# Patient Record
Sex: Female | Born: 1980 | Hispanic: Yes | Marital: Married | Smoking: Never smoker
Health system: Southern US, Community
[De-identification: ages and names within clinical notes are randomized; demographics above are authoritative.]

## PROBLEM LIST (undated history)

## (undated) DIAGNOSIS — Z789 Other specified health status: Secondary | ICD-10-CM

## (undated) HISTORY — PX: NO PAST SURGERIES: SHX2092

---

## 2004-04-09 ENCOUNTER — Inpatient Hospital Stay: Payer: Self-pay

## 2006-08-21 ENCOUNTER — Ambulatory Visit: Payer: Self-pay | Admitting: Family Medicine

## 2006-12-27 ENCOUNTER — Inpatient Hospital Stay: Payer: Self-pay | Admitting: Obstetrics and Gynecology

## 2015-07-17 ENCOUNTER — Other Ambulatory Visit: Payer: Self-pay | Admitting: Family Medicine

## 2015-07-17 DIAGNOSIS — Z3482 Encounter for supervision of other normal pregnancy, second trimester: Secondary | ICD-10-CM

## 2015-07-25 ENCOUNTER — Ambulatory Visit
Admission: RE | Admit: 2015-07-25 | Discharge: 2015-07-25 | Disposition: A | Discharge status: Home / Self Care | Payer: Self-pay | Source: Ambulatory Visit | Attending: Family Medicine | Admitting: Family Medicine

## 2015-07-25 DIAGNOSIS — Z3482 Encounter for supervision of other normal pregnancy, second trimester: Secondary | ICD-10-CM | POA: Insufficient documentation

## 2015-07-25 DIAGNOSIS — Z3A18 18 weeks gestation of pregnancy: Secondary | ICD-10-CM | POA: Insufficient documentation

## 2015-12-18 LAB — OB RESULTS CONSOLE RUBELLA ANTIBODY, IGM: RUBELLA: IMMUNE

## 2015-12-18 LAB — OB RESULTS CONSOLE RPR: RPR: NONREACTIVE

## 2015-12-18 LAB — OB RESULTS CONSOLE ANTIBODY SCREEN: Antibody Screen: NEGATIVE

## 2015-12-18 LAB — OB RESULTS CONSOLE HEPATITIS B SURFACE ANTIGEN: Hepatitis B Surface Ag: NEGATIVE

## 2015-12-18 LAB — OB RESULTS CONSOLE ABO/RH: RH TYPE: POSITIVE

## 2015-12-18 LAB — OB RESULTS CONSOLE GC/CHLAMYDIA
CHLAMYDIA, DNA PROBE: NEGATIVE
GC PROBE AMP, GENITAL: NEGATIVE

## 2015-12-18 LAB — OB RESULTS CONSOLE GBS: GBS: POSITIVE

## 2015-12-18 LAB — OB RESULTS CONSOLE HIV ANTIBODY (ROUTINE TESTING): HIV: NONREACTIVE

## 2015-12-18 LAB — OB RESULTS CONSOLE VARICELLA ZOSTER ANTIBODY, IGG: VARICELLA IGG: NON-IMMUNE/NOT IMMUNE

## 2015-12-21 ENCOUNTER — Inpatient Hospital Stay
Admission: EM | Admit: 2015-12-21 | Discharge: 2015-12-23 | DRG: 775 | Disposition: A | Discharge status: Home / Self Care | Payer: Medicaid Other | Attending: Obstetrics and Gynecology | Admitting: Obstetrics and Gynecology

## 2015-12-21 ENCOUNTER — Inpatient Hospital Stay: Payer: Medicaid Other | Admitting: Anesthesiology

## 2015-12-21 DIAGNOSIS — D62 Acute posthemorrhagic anemia: Secondary | ICD-10-CM | POA: Diagnosis present

## 2015-12-21 DIAGNOSIS — O99824 Streptococcus B carrier state complicating childbirth: Principal | ICD-10-CM | POA: Diagnosis present

## 2015-12-21 DIAGNOSIS — O9902 Anemia complicating childbirth: Secondary | ICD-10-CM | POA: Diagnosis present

## 2015-12-21 DIAGNOSIS — Z3A40 40 weeks gestation of pregnancy: Secondary | ICD-10-CM

## 2015-12-21 HISTORY — DX: Other specified health status: Z78.9

## 2015-12-21 LAB — CBC
HCT: 35.2 % (ref 35.0–47.0)
HEMOGLOBIN: 12.4 g/dL (ref 12.0–16.0)
MCH: 32 pg (ref 26.0–34.0)
MCHC: 35.3 g/dL (ref 32.0–36.0)
MCV: 90.7 fL (ref 80.0–100.0)
Platelets: 186 10*3/uL (ref 150–440)
RBC: 3.88 MIL/uL (ref 3.80–5.20)
RDW: 14.3 % (ref 11.5–14.5)
WBC: 8.2 10*3/uL (ref 3.6–11.0)

## 2015-12-21 LAB — TYPE AND SCREEN
ABO/RH(D): B POS
Antibody Screen: NEGATIVE

## 2015-12-21 MED ORDER — OXYTOCIN 10 UNIT/ML IJ SOLN
INTRAMUSCULAR | Status: AC
  Filled 2015-12-21: qty 2

## 2015-12-21 MED ORDER — SODIUM CHLORIDE 0.9% FLUSH: 3.0000 mL | INTRAVENOUS | Status: DC | PRN

## 2015-12-21 MED ORDER — ONDANSETRON HCL 4 MG/2ML IJ SOLN: 4.0000 mg | Freq: Four times a day (QID) | INTRAMUSCULAR | Status: DC | PRN

## 2015-12-21 MED ORDER — NALBUPHINE HCL 10 MG/ML IJ SOLN: 5.0000 mg | INTRAMUSCULAR | Status: DC | PRN

## 2015-12-21 MED ORDER — MISOPROSTOL 200 MCG PO TABS
ORAL_TABLET | ORAL | Status: AC
  Filled 2015-12-21: qty 4

## 2015-12-21 MED ORDER — OXYCODONE-ACETAMINOPHEN 5-325 MG PO TABS: 1.0000 | ORAL_TABLET | ORAL | Status: DC | PRN

## 2015-12-21 MED ORDER — SODIUM CHLORIDE FLUSH 0.9 % IV SOLN
INTRAVENOUS | Status: AC
  Filled 2015-12-21: qty 10

## 2015-12-21 MED ORDER — AMPICILLIN SODIUM 2 G IJ SOLR
2.0000 g | Freq: Once | INTRAMUSCULAR | Status: DC
Start: 2015-12-21 — End: 2015-12-21

## 2015-12-21 MED ORDER — DEXTROSE 5 % IV SOLN: 1.0000 ug/kg/h | INTRAVENOUS | Status: DC | PRN

## 2015-12-21 MED ORDER — LACTATED RINGERS IV SOLN
INTRAVENOUS | Status: DC
Start: 2015-12-21 — End: 2015-12-22
  Administered 2015-12-21 – 2015-12-22 (×3): via INTRAVENOUS

## 2015-12-21 MED ORDER — PENICILLIN G POTASSIUM 5000000 UNITS IJ SOLR
2.5000 10*6.[IU] | INTRAMUSCULAR | Status: DC
  Administered 2015-12-21 (×2): 2.5 10*6.[IU] via INTRAVENOUS
  Filled 2015-12-21 (×10): qty 2.5

## 2015-12-21 MED ORDER — LACTATED RINGERS IV SOLN: 500.0000 mL | INTRAVENOUS | Status: DC | PRN

## 2015-12-21 MED ORDER — LIDOCAINE HCL (PF) 1 % IJ SOLN
INTRAMUSCULAR | Status: AC
  Administered 2015-12-21: 30 mL via SUBCUTANEOUS
  Filled 2015-12-21: qty 30

## 2015-12-21 MED ORDER — BUPIVACAINE HCL (PF) 0.25 % IJ SOLN
INTRAMUSCULAR | Status: DC | PRN
  Administered 2015-12-21: 5 mL via EPIDURAL

## 2015-12-21 MED ORDER — DEXTROSE 5 % IV SOLN
5.0000 10*6.[IU] | Freq: Once | INTRAVENOUS | Status: AC
  Administered 2015-12-21: 5 10*6.[IU] via INTRAVENOUS
  Filled 2015-12-21: qty 5

## 2015-12-21 MED ORDER — BUTORPHANOL TARTRATE 1 MG/ML IJ SOLN: 1.0000 mg | INTRAMUSCULAR | Status: DC | PRN

## 2015-12-21 MED ORDER — ACETAMINOPHEN 325 MG PO TABS
650.0000 mg | ORAL_TABLET | ORAL | Status: DC | PRN
  Administered 2015-12-21: 650 mg via ORAL
  Filled 2015-12-21: qty 2

## 2015-12-21 MED ORDER — OXYTOCIN BOLUS FROM INFUSION
500.0000 mL | Freq: Once | INTRAVENOUS | Status: AC
  Administered 2015-12-21: 500 mL via INTRAVENOUS

## 2015-12-21 MED ORDER — NALBUPHINE HCL 10 MG/ML IJ SOLN: 5.0000 mg | Freq: Once | INTRAMUSCULAR | Status: DC | PRN

## 2015-12-21 MED ORDER — BUTORPHANOL TARTRATE 1 MG/ML IJ SOLN
1.0000 mg | INTRAMUSCULAR | Status: DC | PRN
  Administered 2015-12-21: 1 mg via INTRAVENOUS
  Filled 2015-12-21: qty 1

## 2015-12-21 MED ORDER — LIDOCAINE HCL (PF) 1 % IJ SOLN
30.0000 mL | INTRAMUSCULAR | Status: DC | PRN
  Administered 2015-12-21: 30 mL via SUBCUTANEOUS
  Filled 2015-12-21: qty 30

## 2015-12-21 MED ORDER — FENTANYL 2.5 MCG/ML W/ROPIVACAINE 0.2% IN NS 100 ML EPIDURAL INFUSION (ARMC-ANES)
EPIDURAL | Status: DC | PRN
  Administered 2015-12-21: 10 mL/h via EPIDURAL

## 2015-12-21 MED ORDER — OXYCODONE-ACETAMINOPHEN 5-325 MG PO TABS: 2.0000 | ORAL_TABLET | ORAL | Status: DC | PRN

## 2015-12-21 MED ORDER — FENTANYL 2.5 MCG/ML W/ROPIVACAINE 0.2% IN NS 100 ML EPIDURAL INFUSION (ARMC-ANES)
10.0000 mL/h | EPIDURAL | Status: DC
Start: 2015-12-22 — End: 2015-12-22

## 2015-12-21 MED ORDER — LIDOCAINE-EPINEPHRINE (PF) 1.5 %-1:200000 IJ SOLN
INTRAMUSCULAR | Status: DC | PRN
  Administered 2015-12-21: 3 mL via EPIDURAL

## 2015-12-21 MED ORDER — MISOPROSTOL 25 MCG QUARTER TABLET
25.0000 ug | ORAL_TABLET | ORAL | Status: DC | PRN
  Filled 2015-12-21: qty 1

## 2015-12-21 MED ORDER — LIDOCAINE HCL (PF) 1 % IJ SOLN
INTRAMUSCULAR | Status: DC | PRN
  Administered 2015-12-21: 1 mL via INTRADERMAL

## 2015-12-21 MED ORDER — AMMONIA AROMATIC IN INHA
RESPIRATORY_TRACT | Status: AC
  Filled 2015-12-21: qty 10

## 2015-12-21 MED ORDER — DIPHENHYDRAMINE HCL 25 MG PO CAPS: 25.0000 mg | ORAL_CAPSULE | ORAL | Status: DC | PRN

## 2015-12-21 MED ORDER — NALOXONE HCL 0.4 MG/ML IJ SOLN: 0.4000 mg | INTRAMUSCULAR | Status: DC | PRN

## 2015-12-21 MED ORDER — FENTANYL 2.5 MCG/ML W/ROPIVACAINE 0.2% IN NS 100 ML EPIDURAL INFUSION (ARMC-ANES)
EPIDURAL | Status: AC
  Filled 2015-12-21: qty 100

## 2015-12-21 MED ORDER — DIPHENHYDRAMINE HCL 50 MG/ML IJ SOLN: 12.5000 mg | INTRAMUSCULAR | Status: DC | PRN

## 2015-12-21 MED ORDER — OXYTOCIN 40 UNITS IN LACTATED RINGERS INFUSION - SIMPLE MED
1.0000 m[IU]/min | INTRAVENOUS | Status: DC
  Administered 2015-12-21: 1 m[IU]/min via INTRAVENOUS
  Filled 2015-12-21: qty 1000

## 2015-12-21 MED ORDER — OXYTOCIN 40 UNITS IN LACTATED RINGERS INFUSION - SIMPLE MED
2.5000 [IU]/h | INTRAVENOUS | Status: DC
Start: 2015-12-21 — End: 2015-12-22

## 2015-12-21 MED ORDER — SOD CITRATE-CITRIC ACID 500-334 MG/5ML PO SOLN: 30.0000 mL | ORAL | Status: DC | PRN

## 2015-12-21 MED ORDER — TERBUTALINE SULFATE 1 MG/ML IJ SOLN: 0.2500 mg | Freq: Once | INTRAMUSCULAR | Status: DC | PRN

## 2015-12-21 NOTE — Lactation Note (Signed)
Consultation Note  Patient Name: Abbagail Hilinski [id number] Date: 12/21/2015 Reason for consult: Initial assessment   Maternal Data  Mother initially stated that she wants to feed both breast and bottle. When I clarified she stated that in the hospital seh would breast feed only. She was talking about both over the baby's breast feeding time.Lorenz Coaster Jaykob Minichiello 12/21/2015, 12:16 PM

## 2015-12-21 NOTE — Progress Notes (Signed)
S: Resting comfortably with epidural. + CTX, + LOF, no  VB O: Vitals:   12/21/15 1724 12/21/15 1819 12/21/15 1821 12/21/15 1828  BP: 107/73 122/66 121/65 116/71  Pulse: 79 73 78 87  Resp: 18     Temp: 98.2 F (36.8 C)     TempSrc: Oral     SpO2:   98% 97%  Weight:      Height:         Gen: NAD, AAOx3      Abd: FNTTP      Ext: Non-tender, Nonedmeatous    FHT:+ mod var + accelerations no decelerations, Cat 1 TOCO: Q2  Min, MVU 290-310 SVE:5/80%/vtx-1   A/P:  35 y.o. yo G3P2002 at [redacted]w[redacted]d for IOL  Labor:Progressing  FWB: Reassuring Cat 1 tracing. EFW 7#14oz  GBS: pos  Antibiotics per orders   Catheryn Bacon 6:39 PM

## 2015-12-21 NOTE — Anesthesia Preprocedure Evaluation (Signed)
Anesthesia Evaluation  Patient identified by MRN, date of birth, ID band Patient awake    Reviewed: Allergy & Precautions, H&P , NPO status , Patient's Chart, lab work & pertinent test results  Airway Mallampati: III  TM Distance: >3 FB Neck ROM: full    Dental  (+) Poor Dentition   Pulmonary neg pulmonary ROS,    Pulmonary exam normal breath sounds clear to auscultation       Cardiovascular Exercise Tolerance: Good (-) hypertensionnegative cardio ROS Normal cardiovascular exam Rhythm:regular Rate:Normal     Neuro/Psych  Headaches, Seizures: has a headache pre epidural placement.     GI/Hepatic negative GI ROS,   Endo/Other    Renal/GU   negative genitourinary   Musculoskeletal   Abdominal   Peds  Hematology negative hematology ROS (+)   Anesthesia Other Findings Past Medical History: No date: Medical history non-contributory  Past Surgical History: No date: NO PAST SURGERIES  BMI    Body Mass Index:  39.87 kg/m      Reproductive/Obstetrics (+) Pregnancy                             Anesthesia Physical Anesthesia Plan  ASA: II  Anesthesia Plan: Epidural   Post-op Pain Management:    Induction:   Airway Management Planned:   Additional Equipment:   Intra-op Plan:   Post-operative Plan:   Informed Consent: I have reviewed the patients History and Physical, chart, labs and discussed the procedure including the risks, benefits and alternatives for the proposed anesthesia with the patient or authorized representative who has indicated his/her understanding and acceptance.     Plan Discussed with: Anesthesiologist  Anesthesia Plan Comments:         Anesthesia Quick Evaluation

## 2015-12-21 NOTE — Progress Notes (Signed)
S: Resting comfortably without epidural. + CTX, no LOF, VB O: Vitals:   12/21/15 1136 12/21/15 1401 12/21/15 1403 12/21/15 1604  BP: 104/70 (!) 94/52 (!) 95/49 106/65  Pulse: 91 76 84 77  Resp: 18   18  Temp:    98 F (36.7 C)  TempSrc:    Oral  Weight:      Height:         Gen: NAD, AAOx3      Abd: FNTTP      Ext: Non-tender, Nonedmeatous    FHT: +mod var + accelerations no decelerations TOCO: Q 4-5 min SVE:3/80/vtx-2 Pitocin at 3 mun/min AROM for lt mec mod amt.  At 1616 A/P:  35 y.o. yo G3P2002 at [redacted]w[redacted]d for IOL  Labor: progressing  FWB: Reassuring Cat 1 tracing. EFW 7#14oz  GBS: pos  Antibiotics   Catheryn Bacon 4:19 PM

## 2015-12-21 NOTE — H&P (Signed)
HISTORY AND PHYSICAL  HISTORY OF PRESENT ILLNESS: Sarah Patton is a 35 y.o. G3P1002 at [redacted]w[redacted]d by LMP of unkown dates & [place]  At  18 5/7  week ultrasound with EDD of 12/21/15 with a pregnancy complicated by  presenting for induction of labor.   She has not  been having contractions Q 6 mins and denies leakage of fluid, vaginal bleeding, or decreased fetal movement.     REVIEW OF SYSTEMS: A complete review of systems was performed and was specifically negative for headache, changes in vision, RUQ pain, shortness of breath, chest pain, lower extremity edema and dysuria.   HISTORY:  Past Medical History:  Diagnosis Date  . Medical history non-contributory     Past Surgical History:  Procedure Laterality Date  . NO PAST SURGERIES      No current facility-administered medications on file prior to encounter.    No current outpatient prescriptions on file prior to encounter.     No Known Allergies  OB History  Gravida Para Term Preterm AB Living  3 1 1     2   SAB TAB Ectopic Multiple Live Births          2    # Outcome Date GA Lbr Len/2nd Weight Sex Delivery Anes PTL Lv  3 Current           2 Term 02/27/07 [redacted]w[redacted]d   F Vag-Spont   LIV  1 Gravida 04/09/04 [redacted]w[redacted]d   M Vag-Spont   LIV      Gynecologic History: History of Abnormal Pap Smear: neg History of STI: neg  Social History  Substance Use Topics  . Smoking status: Never Smoker  . Smokeless tobacco: Never Used  . Alcohol use No    PHYSICAL EXAM: Temp:  [98.2 F (36.8 C)] 98.2 F (36.8 C) (07/27 0805) Pulse Rate:  [102] 102 (07/27 0805) Resp:  [18] 18 (07/27 0805) BP: (106)/(65) 106/65 (07/27 0805) Weight:  [218 lb (98.9 kg)] 218 lb (98.9 kg) (07/27 0805)  GENERAL: NAD AAOx3 CHEST:CTAB no increased work of breathing CV:RRR no appreciable murmurs, rubs, gallops ABDOMEN: gravid, nontender, EFW 7#14oz by [place] EXTREMITIES:  Warm and well-perfused, nontender, nonedematous,+ DTRs 0clonus CERVIX: 2/ 50/  vtx-3  FHTs: 140  baseline with mod variability + accelerations and no  decelerations  Toco:UC's q 6 mins  DIAGNOSTIC STUDIES: No results for input(s): WBC, HGB, HCT, PLT, NA, K, CL, CO2, BUN, CREATININE, LABGLOM, GLUCOSE, CALCIUM, BILIDIR, ALKPHOS, AST, ALT, PROT, MG in the last 168 hours.  Invalid input(s): LABALB, UA  PRENATAL STUDIES:  Prenatal Labs:  MBT: B pos; Rubella immune, Varicella non- immune, HIV neg, RPR neg, Hep B neg, GC/CT neg, GBS pos, glucola 88  Last [place]  AF wnl, normal anatomy  ASSESSMENT AND PLAN:  1. Fetal Well being  - Fetal Tracing:q 6 mins - Ultrasound:  reviewed, as above - Group B Streptococcus: pos - Presentation: vtx  confirmed byCJ  2. Routine OB: - Prenatal labs reviewed, as above - Rh B pos  3. Induction of Labor:  -  Contractions external toco in place -  Pelvis proven to 8 # -  Plan for induction with Pitocin  4. Post Partum Planning: - Infant feeding: Breast - Contraception: to be determined

## 2015-12-21 NOTE — Anesthesia Procedure Notes (Signed)
Epidural Patient location during procedure: OB Start time: 12/21/2015 6:09 PM End time: 12/21/2015 6:11 PM  Staffing Anesthesiologist: Katy Fitch K Performed: anesthesiologist   Preanesthetic Checklist Completed: patient identified, site marked, surgical consent, pre-op evaluation, timeout performed, IV checked, risks and benefits discussed and monitors and equipment checked  Epidural Patient position: sitting Prep: Betadine Patient monitoring: heart rate, continuous pulse ox and blood pressure Approach: midline Location: L4-L5 Injection technique: LOR saline  Needle:  Needle type: Tuohy  Needle gauge: 17 G Needle length: 9 cm and 9 Needle insertion depth: 5 cm Catheter type: closed end flexible Catheter size: 19 Gauge Catheter at skin depth: 10 cm Test dose: negative and 1.5% lidocaine with Epi 1:200 K  Assessment Sensory level: T10 Events: blood not aspirated, injection not painful, no injection resistance, negative IV test and no paresthesia  Additional Notes Pt. Evaluated and documentation done after procedure finished. Patient identified. Risks/Benefits/Options discussed with patient including but not limited to bleeding, infection, nerve damage, paralysis, failed block, incomplete pain control, headache, blood pressure changes, nausea, vomiting, reactions to medication both or allergic, itching and postpartum back pain. Confirmed with bedside nurse the patient's most recent platelet count. Confirmed with patient that they are not currently taking any anticoagulation, have any bleeding history or any family history of bleeding disorders. Patient expressed understanding and wished to proceed. All questions were answered. Sterile technique was used throughout the entire procedure. Please see nursing notes for vital signs. Test dose was given through epidural catheter and negative prior to continuing to dose epidural or start infusion. Warning signs of high block given to  the patient including shortness of breath, tingling/numbness in hands, complete motor block, or any concerning symptoms with instructions to call for help. Patient was given instructions on fall risk and not to get out of bed. All questions and concerns addressed with instructions to call with any issues or inadequate analgesia.   Patient tolerated the insertion well without immediate complications.Reason for block:procedure for pain

## 2015-12-21 NOTE — Progress Notes (Signed)
S: Resting comfortably without epidural. + CTX, no LOF, VB O: Vitals:   12/21/15 0805 12/21/15 0954 12/21/15 1136  BP: 106/65  104/70  Pulse: (!) 102  91  Resp: 18 18 18   Temp: 98.2 F (36.8 C)    TempSrc: Oral    Weight: 218 lb (98.9 kg)    Height: 5\' 2"  (1.575 m)       Gen: NAD, AAOx3      Abd: FNTTP      Ext: Non-tender, Nonedmeatous    FHT: +mod var + accelerations no decelerations TOCO: Q 6 min SVE: not re-evaluated   A/P:  35 y.o. yo G3P2002 at [redacted]w[redacted]d for  IOL   Labor: progressing on Pitocin per protocol  FWB: Reassuring Cat 1 tracing. +accels, No decels  GBS: pos    Catheryn Bacon 2:06 PM

## 2015-12-21 NOTE — Progress Notes (Signed)
S: Resting comfortably with epidural. + CTX, Lt mec LOF, no VB O: Vitals:   12/21/15 1828 12/21/15 1836 12/21/15 1850 12/21/15 1906  BP: 116/71 107/65 120/64 115/66  Pulse: 87 81 76 85  Resp:   16   Temp:   97.9 F (36.6 C)   TempSrc:   Oral   SpO2: 97% 97%    Weight:      Height:         Gen: NAD, AAOx3      Abd: FNTTP      Ext: Non-tender, Nonedmeatous    FHT: +mod var + accelerations no decelerations TOCO: Q 2-2.5  Min, MVU's >290 SVE: 6/100/vtx0   A/P:  35 y.o. yo G3P2002 at [redacted]w[redacted]d for  IOL  Labor: progressing  FWB: Reassuring Cat 1 tracing.   GBS:pos  Continue to monitor UC/FHT's  Antiboitics per orders   Catheryn Bacon 8:25 PM

## 2015-12-21 NOTE — Progress Notes (Signed)
VAGINAL DELIVERY NOTE:  Date of Delivery: 12/21/2015 Primary OB:  [facility] Gestational Age/EDD: [redacted]w[redacted]d 12/21/2015, by Ultrasound Antepartum complications: None Attending Physician: Beasely/CJones, CNM Delivery Type: NSVD of viable female  Anesthesia:Local 1% for repair Laceration: 2nd degree lac, Lt labial lac Episiotomy: none Placenta: SDOP intact, lt mec stained Intrapartum complications: None Estimated Blood Loss:400 mls GBS:pos Procedure Details: CTSP due to being C/C/+1 station. Upon exam legs in lithotomy pos, vtx visible at a +3 station. Pt pushed and within 3 pushes, Vtx del, no CAN, then, ant and post shoulder & body del at 2104. To mom's abd. CCx2 and cut by dad after delayed cord clamping. Cord blood obtained and then discarded. SDOP intact at 2109 with FF and lochia mod, no clots. Pitocin IV drip given per protocol. VSS. Hemostasis achieved but, some massage of the fundus was necessary even though it stayed firm. 1% xylocaine inserted for repair. 3-0 CH on CT for repair. Skin to skin bonding with mom.   Baby was 8 #, 1 oz, baby named "Sarah Patton". Plans to breastfeed.

## 2015-12-22 LAB — CBC
HCT: 29.5 % — ABNORMAL LOW (ref 35.0–47.0)
Hemoglobin: 10.3 g/dL — ABNORMAL LOW (ref 12.0–16.0)
MCH: 31.6 pg (ref 26.0–34.0)
MCHC: 34.9 g/dL (ref 32.0–36.0)
MCV: 90.5 fL (ref 80.0–100.0)
Platelets: 171 10*3/uL (ref 150–440)
RBC: 3.26 MIL/uL — ABNORMAL LOW (ref 3.80–5.20)
RDW: 14.2 % (ref 11.5–14.5)
WBC: 10.6 10*3/uL (ref 3.6–11.0)

## 2015-12-22 LAB — RPR: RPR Ser Ql: NONREACTIVE

## 2015-12-22 MED ORDER — BISACODYL 10 MG RE SUPP: 10.0000 mg | Freq: Every day | RECTAL | Status: DC | PRN

## 2015-12-22 MED ORDER — ZOLPIDEM TARTRATE 5 MG PO TABS: 5.0000 mg | ORAL_TABLET | Freq: Every evening | ORAL | Status: DC | PRN

## 2015-12-22 MED ORDER — SODIUM CHLORIDE 0.9 % IV SOLN: 250.0000 mL | INTRAVENOUS | Status: DC | PRN

## 2015-12-22 MED ORDER — BENZOCAINE-MENTHOL 20-0.5 % EX AERO: 1.0000 "application " | INHALATION_SPRAY | CUTANEOUS | Status: DC | PRN

## 2015-12-22 MED ORDER — MEASLES, MUMPS & RUBELLA VAC ~~LOC~~ INJ
0.5000 mL | INJECTION | Freq: Once | SUBCUTANEOUS | Status: DC
  Filled 2015-12-22 (×2): qty 0.5

## 2015-12-22 MED ORDER — OXYCODONE HCL 5 MG PO TABS
10.0000 mg | ORAL_TABLET | ORAL | Status: DC | PRN
Start: 2015-12-22 — End: 2015-12-23

## 2015-12-22 MED ORDER — ONDANSETRON HCL 4 MG/2ML IJ SOLN: 4.0000 mg | INTRAMUSCULAR | Status: DC | PRN

## 2015-12-22 MED ORDER — DIBUCAINE 1 % RE OINT: 1.0000 "application " | TOPICAL_OINTMENT | RECTAL | Status: DC | PRN

## 2015-12-22 MED ORDER — ACETAMINOPHEN 325 MG PO TABS: 650.0000 mg | ORAL_TABLET | ORAL | Status: DC | PRN

## 2015-12-22 MED ORDER — SODIUM CHLORIDE 0.9% FLUSH: 3.0000 mL | Freq: Two times a day (BID) | INTRAVENOUS | Status: DC

## 2015-12-22 MED ORDER — IBUPROFEN 600 MG PO TABS
600.0000 mg | ORAL_TABLET | Freq: Four times a day (QID) | ORAL | Status: DC
  Administered 2015-12-22 – 2015-12-23 (×6): 600 mg via ORAL
  Filled 2015-12-22 (×6): qty 1

## 2015-12-22 MED ORDER — PRENATAL MULTIVITAMIN CH
1.0000 | ORAL_TABLET | Freq: Every day | ORAL | Status: DC
  Administered 2015-12-22 – 2015-12-23 (×2): 1 via ORAL
  Filled 2015-12-22 (×2): qty 1

## 2015-12-22 MED ORDER — TETANUS-DIPHTH-ACELL PERTUSSIS 5-2.5-18.5 LF-MCG/0.5 IM SUSP: 0.5000 mL | Freq: Once | INTRAMUSCULAR | Status: DC

## 2015-12-22 MED ORDER — COCONUT OIL OIL
1.0000 "application " | TOPICAL_OIL | Status: DC | PRN
  Filled 2015-12-22: qty 120

## 2015-12-22 MED ORDER — SENNOSIDES-DOCUSATE SODIUM 8.6-50 MG PO TABS
2.0000 | ORAL_TABLET | ORAL | Status: DC
  Administered 2015-12-22 – 2015-12-23 (×2): 2 via ORAL
  Filled 2015-12-22 (×2): qty 2

## 2015-12-22 MED ORDER — WITCH HAZEL-GLYCERIN EX PADS: 1.0000 "application " | MEDICATED_PAD | CUTANEOUS | Status: DC | PRN

## 2015-12-22 MED ORDER — SIMETHICONE 80 MG PO CHEW: 80.0000 mg | CHEWABLE_TABLET | ORAL | Status: DC | PRN

## 2015-12-22 MED ORDER — ONDANSETRON HCL 4 MG PO TABS: 4.0000 mg | ORAL_TABLET | ORAL | Status: DC | PRN

## 2015-12-22 MED ORDER — OXYCODONE HCL 5 MG PO TABS: 5.0000 mg | ORAL_TABLET | ORAL | Status: DC | PRN

## 2015-12-22 MED ORDER — DIPHENHYDRAMINE HCL 25 MG PO CAPS: 25.0000 mg | ORAL_CAPSULE | Freq: Four times a day (QID) | ORAL | Status: DC | PRN

## 2015-12-22 MED ORDER — SODIUM CHLORIDE 0.9% FLUSH: 3.0000 mL | INTRAVENOUS | Status: DC | PRN

## 2015-12-22 MED ORDER — FLEET ENEMA 7-19 GM/118ML RE ENEM: 1.0000 | ENEMA | Freq: Every day | RECTAL | Status: DC | PRN

## 2015-12-22 NOTE — Anesthesia Postprocedure Evaluation (Signed)
Anesthesia Post Note  Patient: Sarah Patton  Procedure(s) Performed: * No procedures listed *  Patient location during evaluation: Other Anesthesia Type: Epidural Level of consciousness: awake and alert Pain management: pain level controlled Vital Signs Assessment: post-procedure vital signs reviewed and stable Respiratory status: spontaneous breathing Cardiovascular status: blood pressure returned to baseline and stable Postop Assessment: no headache, no backache and no signs of nausea or vomiting Anesthetic complications: no    Last Vitals:  Vitals:   12/22/15 0013 12/22/15 0342  BP: 101/60 (!) 98/52  Pulse: 81 84  Resp: 20 20  Temp: 36.7 C 36.7 C    Last Pain:  Vitals:   12/22/15 0342  TempSrc: Oral  PainSc:                  Alison Stalling

## 2015-12-22 NOTE — Progress Notes (Signed)
Post Partum Day 1 Subjective: no complaints, up ad lib, voiding and tolerating PO  Objective: Blood pressure (!) 90/54, pulse 78, temperature 98 F (36.7 C), temperature source Oral, resp. rate 20, height 5\' 2"  (1.575 m), weight 218 lb (98.9 kg), SpO2 97 %, unknown if currently breastfeeding.  Physical Exam:  General: alert, cooperative and no distress Lochia: appropriate Uterine Fundus: firm Incision: healing well DVT Evaluation: No evidence of DVT seen on physical exam.   Recent Labs  12/21/15 1009 12/22/15 0512  HGB 12.4 10.3*  HCT 35.2 29.5*    Assessment/Plan: Plan for discharge tomorrow and Breastfeeding   LOS: 1 day   Sarah Patton 12/22/2015, 6:09 PM

## 2015-12-23 MED ORDER — IBUPROFEN 600 MG PO TABS: 600.0000 mg | ORAL_TABLET | Freq: Four times a day (QID) | ORAL | 0 refills | Status: DC

## 2015-12-23 MED ORDER — VARICELLA VIRUS VACCINE LIVE 1350 PFU/0.5ML IJ SUSR
0.5000 mL | Freq: Once | INTRAMUSCULAR | Status: AC
  Administered 2015-12-23: 0.5 mL via SUBCUTANEOUS
  Filled 2015-12-23 (×2): qty 0.5

## 2015-12-23 MED ORDER — DOCUSATE SODIUM 100 MG PO CAPS: 100.0000 mg | ORAL_CAPSULE | Freq: Two times a day (BID) | ORAL | 2 refills | Status: DC | PRN

## 2015-12-23 MED ORDER — FERROUS SULFATE 325 (65 FE) MG PO TABS: 325.0000 mg | ORAL_TABLET | Freq: Two times a day (BID) | ORAL | 1 refills | Status: DC

## 2015-12-23 NOTE — Progress Notes (Signed)
Discharge instructions provided. Pt and sig other verbalize understanding of all instructions and follow-up care via interpreter.  Prescriptions given.  Pt discharged to home with infant at 1540 on 12/23/15 via wheelchair by CNA. Reed Breech, RN 12/23/2015 5:09 PM

## 2015-12-23 NOTE — Discharge Instructions (Signed)
Call your doctor for increased pain or vaginal bleeding, temperature above 100.4, depression, or concerns.  No strenuous activity or heavy lifting for 6 weeks.  No intercourse, tampons, douching, or enemas for 6 weeks.  No tub baths-showers only.  No driving for 2 weeks or while taking pain medications.  Continue prenatal vitamin and iron.  Increase calories and fluids while breastfeeding.

## 2015-12-23 NOTE — Discharge Summary (Signed)
Obstetric Discharge Summary Reason for Admission: induction of labor Prenatal Procedures: ultrasound Intrapartum Procedures: spontaneous vaginal delivery Postpartum Procedures: varicella Complications-Operative and Postpartum: 2 degree perineal laceration Hemoglobin  Date Value Ref Range Status  12/22/2015 10.3 (L) 12.0 - 16.0 g/dL Final   HCT  Date Value Ref Range Status  12/22/2015 29.5 (L) 35.0 - 47.0 % Final    Physical Exam:  General: alert, cooperative and appears stated age 35: appropriate Uterine Fundus: firm Incision: healing well DVT Evaluation: No evidence of DVT seen on physical exam.  Discharge Diagnoses: Term Pregnancy-delivered; varicella non-immune; acute blood loss anemia  Discharge Information: Date: 12/23/2015 Activity: pelvic rest Diet: routine Medications: Ibuprofen, Colace and Iron Condition: stable Instructions: refer to practice specific booklet Discharge to: home   Newborn Data: Live born female  Birth Weight: 8 lb 1.5 oz (3670 g) APGAR: 8, 9  Breastfeeding Nexplanon desired  Home with mother.  Sarah Patton 12/23/2015, 10:07 AM

## 2015-12-23 NOTE — Progress Notes (Signed)
Prenatal records indicate that pt received TDaP vaccine on 10/18/15. Reed Breech, RN 12/23/2015 1:25 PM

## 2017-05-31 ENCOUNTER — Emergency Department: Payer: No Typology Code available for payment source

## 2017-05-31 ENCOUNTER — Other Ambulatory Visit: Payer: Self-pay

## 2017-05-31 ENCOUNTER — Emergency Department
Admission: EM | Admit: 2017-05-31 | Discharge: 2017-05-31 | Disposition: A | Discharge status: Home / Self Care | Payer: No Typology Code available for payment source | Attending: Emergency Medicine | Admitting: Emergency Medicine

## 2017-05-31 DIAGNOSIS — S20219A Contusion of unspecified front wall of thorax, initial encounter: Secondary | ICD-10-CM | POA: Insufficient documentation

## 2017-05-31 DIAGNOSIS — M545 Low back pain, unspecified: Secondary | ICD-10-CM

## 2017-05-31 DIAGNOSIS — Y999 Unspecified external cause status: Secondary | ICD-10-CM | POA: Insufficient documentation

## 2017-05-31 DIAGNOSIS — Y939 Activity, unspecified: Secondary | ICD-10-CM | POA: Insufficient documentation

## 2017-05-31 DIAGNOSIS — Z79899 Other long term (current) drug therapy: Secondary | ICD-10-CM | POA: Diagnosis not present

## 2017-05-31 DIAGNOSIS — Y92410 Unspecified street and highway as the place of occurrence of the external cause: Secondary | ICD-10-CM | POA: Insufficient documentation

## 2017-05-31 LAB — POCT PREGNANCY, URINE: PREG TEST UR: NEGATIVE

## 2017-05-31 MED ORDER — NAPROXEN 500 MG PO TABS: 500.0000 mg | ORAL_TABLET | Freq: Two times a day (BID) | ORAL | 0 refills | Status: DC
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
## 2017-05-31 NOTE — Discharge Instructions (Signed)
Follow up with [facility] if any continued problems

## 2019-05-03 ENCOUNTER — Other Ambulatory Visit: Payer: Self-pay

## 2019-05-03 DIAGNOSIS — Z20822 Contact with and (suspected) exposure to covid-19: Secondary | ICD-10-CM

## 2019-05-05 ENCOUNTER — Telehealth: Payer: Self-pay

## 2019-05-05 LAB — NOVEL CORONAVIRUS, NAA: SARS-CoV-2, NAA: NOT DETECTED

## 2019-05-05 NOTE — Telephone Encounter (Signed)
Patient called in and received her negative covid test result  

## 2019-08-15 ENCOUNTER — Ambulatory Visit: Payer: Self-pay | Attending: Internal Medicine

## 2019-08-15 DIAGNOSIS — Z23 Encounter for immunization: Secondary | ICD-10-CM

## 2019-08-15 NOTE — Progress Notes (Signed)
   Covid-19 Vaccination Clinic  Name:  Sarah Patton    MRN: [id number] DOB: 1980-06-04  08/15/2019  Ms. Sarah Patton was observed post Covid-19 immunization for 15 minutes without incident. She was provided with Vaccine Information Sheet and instruction to access the V-Safe system.   Ms. Sarah Patton was instructed to call 911 with any severe reactions post vaccine: Marland Kitchen Difficulty breathing  . Swelling of face and throat  . A fast heartbeat  . A bad rash all over body  . Dizziness and weakness   Immunizations Administered    Name Date Dose VIS Date Route   Pfizer COVID-19 Vaccine 08/15/2019  2:33 PM 0.3 mL 05/07/2019 Intramuscular   Manufacturer: [place]   Lot: [id number]   [place]: [id number]

## 2019-09-05 ENCOUNTER — Ambulatory Visit: Payer: Self-pay | Attending: Internal Medicine

## 2019-09-05 DIAGNOSIS — Z23 Encounter for immunization: Secondary | ICD-10-CM

## 2019-09-05 NOTE — Progress Notes (Signed)
   Covid-19 Vaccination Clinic  Name:  Sarah Patton    MRN: [id number] DOB: 17-Nov-1980  09/05/2019  Ms. Corliss Skains Arriaza was observed post Covid-19 immunization for 15 minutes without incident. She was provided with Vaccine Information Sheet and instruction to access the V-Safe system.   Ms. Moishe Spice was instructed to call 911 with any severe reactions post vaccine: Marland Kitchen Difficulty breathing  . Swelling of face and throat  . A fast heartbeat  . A bad rash all over body  . Dizziness and weakness   Immunizations Administered    Name Date Dose VIS Date Route   Pfizer COVID-19 Vaccine 09/05/2019  2:35 PM 0.3 mL 05/07/2019 Intramuscular   Manufacturer: [place]   Lot: [id number]   [place]: [id number]

## 2021-08-17 ENCOUNTER — Other Ambulatory Visit (HOSPITAL_COMMUNITY): Payer: Self-pay | Admitting: Primary Care

## 2021-08-17 ENCOUNTER — Other Ambulatory Visit: Payer: Self-pay | Admitting: Primary Care

## 2021-08-17 DIAGNOSIS — Z3481 Encounter for supervision of other normal pregnancy, first trimester: Secondary | ICD-10-CM

## 2021-09-07 ENCOUNTER — Ambulatory Visit
Admission: RE | Admit: 2021-09-07 | Discharge: 2021-09-07 | Disposition: A | Discharge status: Home / Self Care | Payer: BLUE CROSS/BLUE SHIELD | Source: Ambulatory Visit | Attending: Primary Care | Admitting: Primary Care

## 2021-09-07 DIAGNOSIS — Z3A18 18 weeks gestation of pregnancy: Secondary | ICD-10-CM | POA: Insufficient documentation

## 2021-09-07 DIAGNOSIS — Z3689 Encounter for other specified antenatal screening: Secondary | ICD-10-CM | POA: Insufficient documentation

## 2021-09-07 DIAGNOSIS — Z3481 Encounter for supervision of other normal pregnancy, first trimester: Secondary | ICD-10-CM

## 2022-02-01 ENCOUNTER — Other Ambulatory Visit: Payer: Self-pay

## 2022-02-01 DIAGNOSIS — Z3490 Encounter for supervision of normal pregnancy, unspecified, unspecified trimester: Secondary | ICD-10-CM

## 2022-02-01 NOTE — Progress Notes (Signed)
[id number] at [redacted]w[redacted]d LMP of 04/15/21, not c/w early [place] 154w3d Scheduled for induction of labor for Postdates on 02/12/22.   Prenatal provider: ChPrincella Ionregnancy complicated by: AMA age [age]besity  Prenatal Labs: Blood type/Rh B POS  Antibody screen neg  Rubella immune    Varicella Immune  RPR NR  HBsAg   Neg  Hep C NR  HIV   NR  GC neg  Chlamydia neg  Genetic screening cfDNA negative  1 hour GTT 103  3 hour GTT N/A  GBS   neg   Tdap: 12/04/21 Flu: declined Contraception:  Nexplanon or Depo Feeding preference: breast feeding  ____ FeAvelino Leeds CNM Certified Nurse Midwife [facility]

## 2022-02-02 ENCOUNTER — Observation Stay: Payer: Medicaid Other

## 2022-02-02 ENCOUNTER — Encounter
Admission: EM | Disposition: A | Discharge status: Home / Self Care | Payer: Self-pay | Source: Home / Self Care | Attending: Obstetrics

## 2022-02-02 ENCOUNTER — Inpatient Hospital Stay: Payer: Medicaid Other | Admitting: Anesthesiology

## 2022-02-02 ENCOUNTER — Inpatient Hospital Stay
Admission: EM | Admit: 2022-02-02 | Discharge: 2022-02-03 | DRG: 785 | Disposition: A | Discharge status: Home / Self Care | Payer: Medicaid Other | Attending: Obstetrics | Admitting: Obstetrics

## 2022-02-02 ENCOUNTER — Other Ambulatory Visit: Payer: Self-pay

## 2022-02-02 DIAGNOSIS — D251 Intramural leiomyoma of uterus: Secondary | ICD-10-CM | POA: Diagnosis present

## 2022-02-02 DIAGNOSIS — Z3A39 39 weeks gestation of pregnancy: Secondary | ICD-10-CM

## 2022-02-02 DIAGNOSIS — O321XX0 Maternal care for breech presentation, not applicable or unspecified: Principal | ICD-10-CM | POA: Diagnosis present

## 2022-02-02 DIAGNOSIS — D252 Subserosal leiomyoma of uterus: Secondary | ICD-10-CM | POA: Diagnosis present

## 2022-02-02 DIAGNOSIS — O99214 Obesity complicating childbirth: Secondary | ICD-10-CM | POA: Diagnosis present

## 2022-02-02 DIAGNOSIS — O3413 Maternal care for benign tumor of corpus uteri, third trimester: Secondary | ICD-10-CM | POA: Diagnosis present

## 2022-02-02 DIAGNOSIS — Z3490 Encounter for supervision of normal pregnancy, unspecified, unspecified trimester: Principal | ICD-10-CM

## 2022-02-02 DIAGNOSIS — Z302 Encounter for sterilization: Secondary | ICD-10-CM

## 2022-02-02 DIAGNOSIS — O479 False labor, unspecified: Secondary | ICD-10-CM | POA: Diagnosis present

## 2022-02-02 DIAGNOSIS — O26893 Other specified pregnancy related conditions, third trimester: Secondary | ICD-10-CM | POA: Diagnosis present

## 2022-02-02 LAB — COMPREHENSIVE METABOLIC PANEL
ALT: 12 U/L (ref 0–44)
AST: 25 U/L (ref 15–41)
Albumin: 3 g/dL — ABNORMAL LOW (ref 3.5–5.0)
Alkaline Phosphatase: 124 U/L (ref 38–126)
Anion gap: 10 (ref 5–15)
BUN: 14 mg/dL (ref 6–20)
CO2: 19 mmol/L — ABNORMAL LOW (ref 22–32)
Calcium: 9.1 mg/dL (ref 8.9–10.3)
Chloride: 106 mmol/L (ref 98–111)
Creatinine, Ser: 0.69 mg/dL (ref 0.44–1.00)
GFR, Estimated: >60 mL/min (ref >60)
Glucose, Bld: 98 mg/dL (ref 70–99)
Potassium: 3.8 mmol/L (ref 3.5–5.1)
Sodium: 135 mmol/L (ref 135–145)
Total Bilirubin: 0.4 mg/dL (ref 0.3–1.2)
Total Protein: 6.8 g/dL (ref 6.5–8.1)

## 2022-02-02 LAB — TYPE AND SCREEN
ABO/RH(D): B POS
Antibody Screen: NEGATIVE

## 2022-02-02 LAB — CBC
HCT: 36.2 % (ref 36.0–46.0)
HCT: 34.4 % — ABNORMAL LOW (ref 36.0–46.0)
Hemoglobin: 12.4 g/dL (ref 12.0–15.0)
Hemoglobin: 11.8 g/dL — ABNORMAL LOW (ref 12.0–15.0)
MCH: 30.8 pg (ref 26.0–34.0)
MCH: 30.6 pg (ref 26.0–34.0)
MCHC: 34.3 g/dL (ref 30.0–36.0)
MCHC: 34.3 g/dL (ref 30.0–36.0)
MCV: 90 fL (ref 80.0–100.0)
MCV: 89.1 fL (ref 80.0–100.0)
Platelets: 204 10*3/uL (ref 150–400)
Platelets: 203 10*3/uL (ref 150–400)
RBC: 4.02 MIL/uL (ref 3.87–5.11)
RBC: 3.86 MIL/uL — ABNORMAL LOW (ref 3.87–5.11)
RDW: 13.8 % (ref 11.5–15.5)
RDW: 13.8 % (ref 11.5–15.5)
WBC: 10 10*3/uL (ref 4.0–10.5)
WBC: 15 10*3/uL — ABNORMAL HIGH (ref 4.0–10.5)
nRBC: 0.2 % (ref 0.0–0.2)
nRBC: 0 % (ref 0.0–0.2)

## 2022-02-02 LAB — PROTEIN / CREATININE RATIO, URINE
Creatinine, Urine: 22 mg/dL
Total Protein, Urine: <6 mg/dL

## 2022-02-02 SURGERY — Surgical Case
Anesthesia: Spinal | Laterality: Bilateral

## 2022-02-02 MED ORDER — KETOROLAC TROMETHAMINE 30 MG/ML IJ SOLN
30.0000 mg | Freq: Four times a day (QID) | INTRAMUSCULAR | Status: DC
  Administered 2022-02-02: 30 mg via INTRAVENOUS
  Filled 2022-02-02: qty 1

## 2022-02-02 MED ORDER — LACTATED RINGERS IV SOLN: INTRAVENOUS | Status: DC

## 2022-02-02 MED ORDER — MIDAZOLAM HCL 2 MG/2ML IJ SOLN
INTRAMUSCULAR | Status: DC | PRN
  Administered 2022-02-02: 2 mg via INTRAVENOUS

## 2022-02-02 MED ORDER — OXYCODONE HCL 5 MG PO TABS: 5.0000 mg | ORAL_TABLET | Freq: Once | ORAL | Status: DC | PRN

## 2022-02-02 MED ORDER — ONDANSETRON HCL 4 MG/2ML IJ SOLN
INTRAMUSCULAR | Status: DC | PRN
  Administered 2022-02-02: 4 mg via INTRAVENOUS

## 2022-02-02 MED ORDER — OXYTOCIN-SODIUM CHLORIDE 30-0.9 UT/500ML-% IV SOLN
INTRAVENOUS | Status: AC
  Filled 2022-02-02: qty 1000

## 2022-02-02 MED ORDER — PHENYLEPHRINE HCL-NACL 20-0.9 MG/250ML-% IV SOLN
INTRAVENOUS | Status: DC | PRN
  Administered 2022-02-02: 50 ug/min via INTRAVENOUS

## 2022-02-02 MED ORDER — PRENATAL MULTIVITAMIN CH
1.0000 | ORAL_TABLET | Freq: Every day | ORAL | Status: DC
  Administered 2022-02-03: 1 via ORAL
  Filled 2022-02-02: qty 1

## 2022-02-02 MED ORDER — FENTANYL CITRATE (PF) 100 MCG/2ML IJ SOLN
INTRAMUSCULAR | Status: DC | PRN
  Administered 2022-02-02: 15 ug via INTRATHECAL

## 2022-02-02 MED ORDER — WITCH HAZEL-GLYCERIN EX PADS: 1.0000 | MEDICATED_PAD | CUTANEOUS | Status: DC | PRN

## 2022-02-02 MED ORDER — OXYTOCIN-SODIUM CHLORIDE 30-0.9 UT/500ML-% IV SOLN
2.5000 [IU]/h | INTRAVENOUS | Status: DC
  Administered 2022-02-02: 2.5 [IU]/h via INTRAVENOUS

## 2022-02-02 MED ORDER — ACETAMINOPHEN 500 MG PO TABS
1000.0000 mg | ORAL_TABLET | Freq: Four times a day (QID) | ORAL | Status: DC
  Administered 2022-02-02 – 2022-02-03 (×4): 1000 mg via ORAL
  Filled 2022-02-02 (×4): qty 2

## 2022-02-02 MED ORDER — SODIUM CHLORIDE (PF) 0.9 % IJ SOLN
INTRAMUSCULAR | Status: AC
  Filled 2022-02-02: qty 50

## 2022-02-02 MED ORDER — BISACODYL 10 MG RE SUPP: 10.0000 mg | Freq: Every day | RECTAL | Status: DC | PRN

## 2022-02-02 MED ORDER — OXYCODONE HCL 5 MG PO TABS: 5.0000 mg | ORAL_TABLET | ORAL | Status: DC | PRN

## 2022-02-02 MED ORDER — SENNOSIDES-DOCUSATE SODIUM 8.6-50 MG PO TABS
2.0000 | ORAL_TABLET | ORAL | Status: DC
  Administered 2022-02-02: 2 via ORAL
  Filled 2022-02-02: qty 2

## 2022-02-02 MED ORDER — FERROUS SULFATE 325 (65 FE) MG PO TABS
325.0000 mg | ORAL_TABLET | Freq: Two times a day (BID) | ORAL | Status: DC
  Administered 2022-02-02 – 2022-02-03 (×2): 325 mg via ORAL
  Filled 2022-02-02 (×2): qty 1

## 2022-02-02 MED ORDER — ACETAMINOPHEN 325 MG PO TABS: 650.0000 mg | ORAL_TABLET | Freq: Four times a day (QID) | ORAL | Status: DC

## 2022-02-02 MED ORDER — KETOROLAC TROMETHAMINE 30 MG/ML IJ SOLN: 30.0000 mg | Freq: Four times a day (QID) | INTRAMUSCULAR | Status: DC

## 2022-02-02 MED ORDER — DEXMEDETOMIDINE (PRECEDEX) IN NS 20 MCG/5ML (4 MCG/ML) IV SYRINGE
PREFILLED_SYRINGE | INTRAVENOUS | Status: DC | PRN
  Administered 2022-02-02: 4 ug via INTRAVENOUS

## 2022-02-02 MED ORDER — GABAPENTIN 300 MG PO CAPS
300.0000 mg | ORAL_CAPSULE | Freq: Every day | ORAL | Status: DC
  Administered 2022-02-02: 300 mg via ORAL
  Filled 2022-02-02: qty 1

## 2022-02-02 MED ORDER — SODIUM CHLORIDE (PF) 0.9 % IJ SOLN
INTRAMUSCULAR | Status: DC | PRN
  Administered 2022-02-02: 20 mL

## 2022-02-02 MED ORDER — DIPHENHYDRAMINE HCL 25 MG PO CAPS: 25.0000 mg | ORAL_CAPSULE | ORAL | Status: DC | PRN

## 2022-02-02 MED ORDER — DEXAMETHASONE SODIUM PHOSPHATE 10 MG/ML IJ SOLN
INTRAMUSCULAR | Status: DC | PRN
  Administered 2022-02-02: 10 mg via INTRAVENOUS

## 2022-02-02 MED ORDER — IBUPROFEN 600 MG PO TABS: 600.0000 mg | ORAL_TABLET | Freq: Four times a day (QID) | ORAL | Status: DC

## 2022-02-02 MED ORDER — FENTANYL CITRATE (PF) 100 MCG/2ML IJ SOLN
INTRAMUSCULAR | Status: DC | PRN
  Administered 2022-02-02: 50 ug via INTRAVENOUS

## 2022-02-02 MED ORDER — GABAPENTIN 300 MG PO CAPS
300.0000 mg | ORAL_CAPSULE | ORAL | Status: AC
  Administered 2022-02-02: 300 mg via ORAL
  Filled 2022-02-02: qty 1

## 2022-02-02 MED ORDER — ENOXAPARIN SODIUM 60 MG/0.6ML IJ SOSY
0.5000 mg/kg | PREFILLED_SYRINGE | INTRAMUSCULAR | Status: DC
  Administered 2022-02-03: 55 mg via SUBCUTANEOUS
  Filled 2022-02-02: qty 0.6

## 2022-02-02 MED ORDER — OXYTOCIN-SODIUM CHLORIDE 30-0.9 UT/500ML-% IV SOLN: 2.5000 [IU]/h | INTRAVENOUS | Status: AC

## 2022-02-02 MED ORDER — BUPIVACAINE HCL (PF) 0.5 % IJ SOLN
INTRAMUSCULAR | Status: AC
  Filled 2022-02-02: qty 30

## 2022-02-02 MED ORDER — METHYLERGONOVINE MALEATE 0.2 MG/ML IJ SOLN
INTRAMUSCULAR | Status: AC
  Filled 2022-02-02: qty 1

## 2022-02-02 MED ORDER — OXYCODONE HCL 5 MG/5ML PO SOLN: 5.0000 mg | Freq: Once | ORAL | Status: DC | PRN

## 2022-02-02 MED ORDER — BUPIVACAINE HCL (PF) 0.5 % IJ SOLN
INTRAMUSCULAR | Status: DC | PRN
  Administered 2022-02-02: 60 mL

## 2022-02-02 MED ORDER — SOD CITRATE-CITRIC ACID 500-334 MG/5ML PO SOLN
ORAL | Status: AC
  Filled 2022-02-02: qty 15

## 2022-02-02 MED ORDER — CHLORHEXIDINE GLUCONATE 0.12 % MT SOLN
OROMUCOSAL | Status: AC
  Filled 2022-02-02: qty 15

## 2022-02-02 MED ORDER — LIDOCAINE HCL (PF) 1 % IJ SOLN
INTRAMUSCULAR | Status: DC | PRN
  Administered 2022-02-02: 2 mL via SUBCUTANEOUS

## 2022-02-02 MED ORDER — CEFAZOLIN SODIUM-DEXTROSE 2-4 GM/100ML-% IV SOLN
2.0000 g | INTRAVENOUS | Status: AC
  Administered 2022-02-02: 2 g via INTRAVENOUS
  Filled 2022-02-02: qty 100

## 2022-02-02 MED ORDER — METHYLERGONOVINE MALEATE 0.2 MG/ML IJ SOLN
INTRAMUSCULAR | Status: DC | PRN
  Administered 2022-02-02: .2 mg via INTRAMUSCULAR

## 2022-02-02 MED ORDER — TETANUS-DIPHTH-ACELL PERTUSSIS 5-2.5-18.5 LF-MCG/0.5 IM SUSY: 0.5000 mL | PREFILLED_SYRINGE | Freq: Once | INTRAMUSCULAR | Status: DC

## 2022-02-02 MED ORDER — SODIUM CHLORIDE 0.9 % IV SOLN
INTRAVENOUS | Status: AC
  Filled 2022-02-02: qty 5

## 2022-02-02 MED ORDER — LACTATED RINGERS IV BOLUS
1000.0000 mL | Freq: Once | INTRAVENOUS | Status: AC
  Administered 2022-02-02: 1000 mL via INTRAVENOUS

## 2022-02-02 MED ORDER — SODIUM CHLORIDE 0.9% FLUSH: 3.0000 mL | INTRAVENOUS | Status: DC | PRN

## 2022-02-02 MED ORDER — SODIUM CHLORIDE 0.9 % IV SOLN
500.0000 mg | Freq: Once | INTRAVENOUS | Status: AC
  Administered 2022-02-02: 500 mg via INTRAVENOUS
  Filled 2022-02-02: qty 500

## 2022-02-02 MED ORDER — DIBUCAINE (PERIANAL) 1 % EX OINT: 1.0000 | TOPICAL_OINTMENT | CUTANEOUS | Status: DC | PRN

## 2022-02-02 MED ORDER — SIMETHICONE 80 MG PO CHEW: 80.0000 mg | CHEWABLE_TABLET | ORAL | Status: DC | PRN

## 2022-02-02 MED ORDER — MORPHINE SULFATE (PF) 0.5 MG/ML IJ SOLN
INTRAMUSCULAR | Status: DC | PRN
Start: 2022-02-02 — End: 2022-02-02
  Administered 2022-02-02: .1 mg via INTRATHECAL

## 2022-02-02 MED ORDER — 0.9 % SODIUM CHLORIDE (POUR BTL) OPTIME
TOPICAL | Status: DC | PRN
  Administered 2022-02-02: 600 mL

## 2022-02-02 MED ORDER — SIMETHICONE 80 MG PO CHEW
80.0000 mg | CHEWABLE_TABLET | Freq: Three times a day (TID) | ORAL | Status: DC
  Administered 2022-02-02 – 2022-02-03 (×2): 80 mg via ORAL
  Filled 2022-02-02 (×2): qty 1

## 2022-02-02 MED ORDER — COCONUT OIL OIL: 1.0000 | TOPICAL_OIL | Status: DC | PRN

## 2022-02-02 MED ORDER — POVIDONE-IODINE 10 % EX SWAB
2.0000 | Freq: Once | CUTANEOUS | Status: DC
  Administered 2022-02-02: 2 via TOPICAL

## 2022-02-02 MED ORDER — ONDANSETRON HCL 4 MG/2ML IJ SOLN: 4.0000 mg | Freq: Three times a day (TID) | INTRAMUSCULAR | Status: DC | PRN

## 2022-02-02 MED ORDER — MEASLES, MUMPS & RUBELLA VAC IJ SOLR: 0.5000 mL | Freq: Once | INTRAMUSCULAR | Status: DC

## 2022-02-02 MED ORDER — MENTHOL 3 MG MT LOZG: 1.0000 | LOZENGE | OROMUCOSAL | Status: DC | PRN

## 2022-02-02 MED ORDER — OXYTOCIN BOLUS FROM INFUSION: 333.0000 mL | Freq: Once | INTRAVENOUS | Status: DC

## 2022-02-02 MED ORDER — BUPIVACAINE IN DEXTROSE 0.75-8.25 % IT SOLN
INTRATHECAL | Status: DC | PRN
  Administered 2022-02-02: 1.6 mL via INTRATHECAL

## 2022-02-02 MED ORDER — SOD CITRATE-CITRIC ACID 500-334 MG/5ML PO SOLN
30.0000 mL | ORAL | Status: AC
  Administered 2022-02-02: 30 mL via ORAL

## 2022-02-02 MED ORDER — NALOXONE HCL 0.4 MG/ML IJ SOLN: 0.4000 mg | INTRAMUSCULAR | Status: DC | PRN

## 2022-02-02 MED ORDER — TRANEXAMIC ACID-NACL 1000-0.7 MG/100ML-% IV SOLN
INTRAVENOUS | Status: AC
  Filled 2022-02-02: qty 100

## 2022-02-02 MED ORDER — DIPHENHYDRAMINE HCL 50 MG/ML IJ SOLN: 12.5000 mg | INTRAMUSCULAR | Status: DC | PRN

## 2022-02-02 MED ORDER — FENTANYL CITRATE (PF) 100 MCG/2ML IJ SOLN: 25.0000 ug | INTRAMUSCULAR | Status: DC | PRN

## 2022-02-02 MED ORDER — DIPHENHYDRAMINE HCL 25 MG PO CAPS: 25.0000 mg | ORAL_CAPSULE | Freq: Four times a day (QID) | ORAL | Status: DC | PRN

## 2022-02-02 MED ORDER — OXYTOCIN-SODIUM CHLORIDE 30-0.9 UT/500ML-% IV SOLN
INTRAVENOUS | Status: DC | PRN
  Administered 2022-02-02: 300 mL via INTRAVENOUS

## 2022-02-02 MED ORDER — MEPERIDINE HCL 25 MG/ML IJ SOLN: 6.2500 mg | INTRAMUSCULAR | Status: DC | PRN

## 2022-02-02 MED ORDER — ACETAMINOPHEN 500 MG PO TABS
1000.0000 mg | ORAL_TABLET | ORAL | Status: AC
  Administered 2022-02-02: 1000 mg via ORAL
  Filled 2022-02-02: qty 2

## 2022-02-02 MED ORDER — FLEET ENEMA 7-19 GM/118ML RE ENEM: 1.0000 | ENEMA | Freq: Every day | RECTAL | Status: DC | PRN

## 2022-02-02 MED ORDER — NALOXONE HCL 4 MG/10ML IJ SOLN: 1.0000 ug/kg/h | INTRAVENOUS | Status: DC | PRN

## 2022-02-02 SURGICAL SUPPLY — 31 items
CHLORAPREP W/TINT 26 (MISCELLANEOUS) ×2 IMPLANT
DRSG TELFA 3X8 NADH STRL (GAUZE/BANDAGES/DRESSINGS) ×2 IMPLANT
ELECT REM PT RETURN 9FT ADLT (ELECTROSURGICAL) ×2
ELECTRODE REM PT RTRN 9FT ADLT (ELECTROSURGICAL) ×2 IMPLANT
GAUZE SPONGE 4X4 12PLY STRL (GAUZE/BANDAGES/DRESSINGS) ×2 IMPLANT
GOWN STRL REUS W/ TWL LRG LVL3 (GOWN DISPOSABLE) ×6 IMPLANT
GOWN STRL REUS W/TWL LRG LVL3 (GOWN DISPOSABLE) ×6
MANIFOLD NEPTUNE II (INSTRUMENTS) ×2 IMPLANT
MAT PREVALON FULL STRYKER (MISCELLANEOUS) ×2 IMPLANT
NDL HYPO 25GX1X1/2 BEV (NEEDLE) ×1 IMPLANT
NEEDLE HYPO 25GX1X1/2 BEV (NEEDLE) ×2 IMPLANT
NS IRRIG 1000ML POUR BTL (IV SOLUTION) ×2 IMPLANT
PACK C SECTION AR (MISCELLANEOUS) ×2 IMPLANT
PAD ABD 8X10 STRL (GAUZE/BANDAGES/DRESSINGS) ×1 IMPLANT
PAD OB MATERNITY 4.3X12.25 (PERSONAL CARE ITEMS) ×2 IMPLANT
PAD PREP 24X41 OB/GYN DISP (PERSONAL CARE ITEMS) ×2 IMPLANT
SCRUB CHG 4% DYNA-HEX 4OZ (MISCELLANEOUS) ×2 IMPLANT
SUT MNCRL 4-0 (SUTURE) ×2
SUT MNCRL 4-0 27XMFL (SUTURE) ×2
SUT SILK 3 0 REEL (SUTURE) ×1 IMPLANT
SUT VIC AB 0 CT1 36 (SUTURE) ×4 IMPLANT
SUT VIC AB 0 CTX 36 (SUTURE) ×4
SUT VIC AB 0 CTX36XBRD ANBCTRL (SUTURE) ×4 IMPLANT
SUT VIC AB 2-0 SH 27 (SUTURE) ×4
SUT VIC AB 2-0 SH 27XBRD (SUTURE) ×4 IMPLANT
SUT VICRYL 3-0 27IN SH (SUTURE) ×1 IMPLANT
SUT VICRYL+ 3-0 36IN CT-1 (SUTURE) ×1 IMPLANT
SUTURE MNCRL 4-0 27XMF (SUTURE) ×2 IMPLANT
SYR 30ML LL (SYRINGE) ×4 IMPLANT
TRAP FLUID SMOKE EVACUATOR (MISCELLANEOUS) ×2 IMPLANT
WATER STERILE IRR 500ML POUR (IV SOLUTION) ×2 IMPLANT

## 2022-02-02 NOTE — Transfer of Care (Signed)
Immediate Anesthesia Transfer of Care Note  Patient: Sarah Patton  Procedure(s) Performed: CESAREAN SECTION WITH BILATERAL TUBAL LIGATION (Bilateral)  Patient Location: LDR6  Anesthesia Type:Spinal  Level of Consciousness: awake, alert  and oriented  Airway & Oxygen Therapy: Patient Spontanous Breathing  Post-op Assessment: Report given to RN and Post -op Vital signs reviewed and stable  Post vital signs: Reviewed and stable  Last Vitals:  Vitals Value Taken Time  BP 111/69   Temp    Pulse 72   Resp 16   SpO2 98     Last Pain:  Vitals:   02/02/22 0752  TempSrc:   PainSc: 6       Patients Stated Pain Goal: 0 (03/00/92 3300)  Complications: No notable events documented.

## 2022-02-02 NOTE — Anesthesia Preprocedure Evaluation (Addendum)
Anesthesia Evaluation  Patient identified by MRN, date of birth, ID band Patient awake    Reviewed: Allergy & Precautions, NPO status , Patient's Chart, lab work & pertinent test results  History of Anesthesia Complications Negative for: history of anesthetic complications  Airway Mallampati: III  TM Distance: >3 FB Neck ROM: full    Dental  (+) Chipped   Pulmonary neg pulmonary ROS, neg shortness of breath,    Pulmonary exam normal        Cardiovascular Exercise Tolerance: Good (-) hypertensionnegative cardio ROS Normal cardiovascular exam     Neuro/Psych    GI/Hepatic negative GI ROS,   Endo/Other  Morbid obesity  Renal/GU   negative genitourinary   Musculoskeletal   Abdominal   Peds  Hematology negative hematology ROS (+)   Anesthesia Other Findings Past Medical History: No date: Medical history non-contributory  Past Surgical History: No date: NO PAST SURGERIES  BMI    Body Mass Index: 44.81 kg/m      Reproductive/Obstetrics (+) Pregnancy                             Anesthesia Physical Anesthesia Plan  ASA: 3 and emergent  Anesthesia Plan: Spinal   Post-op Pain Management:    Induction:   PONV Risk Score and Plan:   Airway Management Planned: Natural Airway and Nasal Cannula  Additional Equipment:   Intra-op Plan:   Post-operative Plan:   Informed Consent: I have reviewed the patients History and Physical, chart, labs and discussed the procedure including the risks, benefits and alternatives for the proposed anesthesia with the patient or authorized representative who has indicated his/her understanding and acceptance.     Dental Advisory Given and Interpreter used for interveiw  Plan Discussed with: Anesthesiologist, CRNA and Surgeon  Anesthesia Plan Comments: (Patient reports no bleeding problems and no anticoagulant use.  Plan for spinal with backup  GA  Patient consented for risks of anesthesia including but not limited to:  - adverse reactions to medications - damage to eyes, teeth, lips or other oral mucosa - nerve damage due to positioning  - risk of bleeding, infection and or nerve damage from spinal that could lead to paralysis - risk of headache or failed spinal - damage to teeth, lips or other oral mucosa - sore throat or hoarseness - damage to heart, brain, nerves, lungs, other parts of body or loss of life  Patient voiced understanding.)       Anesthesia Quick Evaluation

## 2022-02-02 NOTE — Discharge Summary (Signed)
Obstetrical Discharge Summary  Patient Name: Sarah Patton DOB: 05-Apr-1981 MRN: [id number]  Date of Admission: 02/02/2022 Date of Delivery: 02/02/22 Delivered by: Drinda Butts, CNM  Date of Discharge: 02/03/2022  Primary OB: Princella Ion LMP:No LMP recorded. EDC Estimated Date of Delivery: 02/05/22 Gestational Age at Delivery: [redacted]w[redacted]d  Antepartum complications:  AMA age [age]Obesity  Admitting Diagnosis: Pregnancy [Z34.90] Uterine contractions [O47.9] Breech presentation.  Secondary Diagnosis: Primary LTCS  Patient Active Problem List   Diagnosis Date Noted   Pregnancy 02/02/2022   Uterine contractions 02/02/2022   Indication for care in labor or delivery 12/21/2015    Discharge Diagnosis: Term Pregnancy Delivered     Augmentation: N/A Complications: None Intrapartum complications/course: pt arrived for labor check with regular painful contractions, Cat II tracing noted, and breech/transverse presentation. Briefly attempted external cephalic version in the OR, unsuccessful then proceeded to primary LTCS. See Op note.  Delivery Type: primary cesarean section, low transverse incision Anesthesia: spinal anesthesia Placenta: manual removal To Pathology: No  Laceration: none Episiotomy: none  Newborn Data: Live born female "Raquel" Birth Weight: 7 lb 7.2 oz (3380 g) APGAR: 1, 9  Newborn Delivery   Birth date/time: 02/02/2022 10:15:00 Delivery type: C-Section, Low Transverse Trial of labor: No C-section categorization: Primary      Postpartum Procedures: none [place]:      No data to display           Post partum course: Cesarean Section:  Patient had an uncomplicated postpartum course.  By time of discharge on POD#1, her pain was controlled on oral pain medications; she had appropriate lochia and was ambulating, voiding without difficulty, tolerating regular diet and passing flatus.   She was deemed stable for discharge to home.    Discharge Physical Exam:  BP  117/73 (BP Location: Right Arm)   Pulse 78   Temp 97.6 F (36.4 C) (Oral)   Resp 17   Ht '5\' 2"'$  (1.575 m)   Wt 111.1 kg   SpO2 98%   Breastfeeding Yes   BMI 44.81 kg/m   General: NAD CV: RRR Pulm: CTABL, nl effort ABD: s/nd/nt, fundus firm and below the umbilicus Lochia: moderate Incision: c/d/I, covered with occlusive OP site dressing  DVT Evaluation: LE non-ttp, no evidence of DVT on exam.  Hemoglobin  Date Value Ref Range Status  02/03/2022 10.2 (L) 12.0 - 15.0 g/dL Final   HCT  Date Value Ref Range Status  02/03/2022 29.8 (L) 36.0 - 46.0 % Final    Risk assessment for postpartum VTE and prophylactic treatment: Very high risk factors: None High risk factors: BMI 40-50 kg/m2 and Unscheduled cesarean after labor  Moderate risk factors: None  Postpartum VTE prophylaxis with LMWH ordered  Disposition: stable, discharge to home. Baby Feeding: breast and formula feeding Baby Disposition: home with mom  Rh Immune globulin indicated: No Rubella vaccine given: was not indicated Varivax vaccine given: was not indicated Flu vaccine given in AP setting: No Tdap vaccine given in AP setting: Yes 12/04/21  Contraception: bilateral tubal ligation  Prenatal Labs:  Blood type/Rh B POS  Antibody screen neg  Rubella immune    Varicella Immune  RPR NR  HBsAg   Neg  Hep C NR  HIV   NR  GC neg  Chlamydia neg  Genetic screening cfDNA negative  1 hour GTT 103  3 hour GTT N/A  GBS   neg    Plan:  Sarah Patton was discharged to home in good  condition. Follow-up appointment with delivering provider in 1-2 weeks.  Discharge Medications: Allergies as of 02/03/2022   No Known Allergies      Medication List     STOP taking these medications    docusate sodium 100 MG capsule Commonly known as: COLACE   naproxen 500 MG tablet Commonly known as: Naprosyn       TAKE these medications    acetaminophen 500 MG tablet Commonly known as: TYLENOL Take 2  tablets (1,000 mg total) by mouth every 6 (six) hours.   coconut oil Oil Apply 1 Application topically as needed.   diphenhydrAMINE 25 mg capsule Commonly known as: BENADRYL Take 1 capsule (25 mg total) by mouth every 6 (six) hours as needed for itching.   enoxaparin 60 MG/0.6ML injection Commonly known as: LOVENOX Inject 0.55 mLs (55 mg total) into the skin daily for 21 days.   ferrous sulfate 325 (65 FE) MG tablet Take 1 tablet (325 mg total) by mouth 2 (two) times daily with a meal. What changed:  when to take this additional instructions   gabapentin 300 MG capsule Commonly known as: NEURONTIN Take 1 capsule (300 mg total) by mouth at bedtime for 3 days.   ibuprofen 600 MG tablet Commonly known as: ADVIL Take 1 tablet (600 mg total) by mouth every 6 (six) hours.   oxyCODONE 5 MG immediate release tablet Commonly known as: Oxy IR/ROXICODONE Take 1 tablet (5 mg total) by mouth every 4 (four) hours as needed for up to 7 days for moderate pain.   prenatal multivitamin Tabs tablet Take 1 tablet by mouth daily at 12 noon.   senna-docusate 8.6-50 MG tablet Commonly known as: Senokot-S Take 2 tablets by mouth daily.   simethicone 80 MG chewable tablet Commonly known as: MYLICON Chew 1 tablet (80 mg total) by mouth 3 (three) times daily after meals.   witch hazel-glycerin pad Commonly known as: TUCKS Apply 1 Application topically as needed for hemorrhoids.         Follow-up Information     Benjaman Kindler, MD Follow up in 2 week(s).   Specialty: Obstetrics and Gynecology Why: routine post-op followup Contact information: [place] [address] [phone]                 Signed: Francetta Found, CNM 02/03/2022  10:08 AM

## 2022-02-02 NOTE — Progress Notes (Signed)
40yo [id number] at 65w4dpresenting with active contractions and found to be 4cm. However, her FHT are Cat II with min to mod variability, 10x10acels, and found to be frank breech presentation.   Pt seen with spanish interpreter. She is highly motivated to avoid a cesarean section, and requested information about an ECV. We decided to proceed with attempted ECV double set up in the OR under regional anesthesia. If it is not promptly successful, based on how the FWhitinglooks, we will proceed with pLTCS.  ECV discussed, including risks (58% success rate, small chance of spontaneous return to breech, AROM, placental abruption, NRFHT and stat c/s, maternal discomfort), benefits (chance at a vaginal birth) and alternatives (expectant management, cesarean section.) She requested regional anesthesia.   She also requests permanent sterilization. She discussed with her prenatal providers many times, and is now certain that this is what she desires. Other reversible forms of contraception were discussed with patient; she declines all other modalities. Risks of procedure discussed with patient including but not limited to: risk of regret, permanence of method, bleeding, infection, injury to surrounding organs and need for additional procedures.  Failure risk of 1-2 % with increased risk of ectopic gestation if pregnancy occurs was also discussed with patient.  Patient verbalized understanding of these risks and wants to proceed with sterilization.  Written informed consent obtained.  To OR when ready.  The risks of cesarean section discussed with the patient included but were not limited to: bleeding which may require transfusion or reoperation; infection which may require antibiotics; injury to bowel, bladder, ureters or other surrounding organs; injury to the fetus; need for additional procedures including hysterectomy in the event of a life-threatening hemorrhage; placental abnormalities wth subsequent pregnancies,  incisional problems, thromboembolic phenomenon and other postoperative/anesthesia complications. The patient concurred with the proposed plan, giving informed written consent for the procedure.    Anesthesia and OR aware. Preoperative prophylactic antibiotics and SCDs ordered on call to the OR.  To OR when ready.

## 2022-02-02 NOTE — Progress Notes (Signed)
ESV Attempt Begin in OR 0071 by Denver Faster MD and R McVey CNM.  Initial FHT 159.  Pt verbalized consent and consent signed. Chief Operating Officer at Bedside.  [place] FHT 130.   Bedside US  by provider. Baby unable to turn to cephalic presentation.  ESV attempt ended 0956. Pt to proceed with Primary C/S.

## 2022-02-02 NOTE — Op Note (Addendum)
Cesarean Section Procedure Note  Indications: Pilar Plate breech, active labor, concerning Cat II strip  Pre-operative Diagnosis:  1. Intrauterine pregnancy at [redacted]w[redacted]d  2. Desires permanent sterilization; 3. Frank breech presentation 4. Concerning Cat II strip 5. Active labor 6. Failed attempt at ECV under spinal 7. Body mass index is 44.81 kg/m.  Post-operative Diagnosis: same, delivered. Enlarged fibroid uterus  Procedure: 1. Primary Low Transverse Cesarean Section through Pfannenstiel incision  2. Bilateral tubal sterilization using modified Parkland method, including fimbriae 3. Left uterine artery ligation 4. External cephalic version under spinal anesthesia in the OR attempted  Surgeon: BBenjaman Kindler MD  Assistant(s):  RHassan Buckler CNM   Anesthesia: Spinal anesthesia  Estimated Blood Loss:   13065m QBL 1000         Drains: Foley         Total IV Fluids: 120043mUrine Output: 150m91m      Specimens: Cord gasses         Complications:  None; patient tolerated the procedure well.         Disposition: PACU - hemodynamically stable.         Condition: stable  Findings: Bedside ultrasound assessment with frank breech. Transverse ECV achieved with a single attempt at maternal request. Fetal heart tones stable at 150s throughout, which was her baseline.   A female infant "Raquel" in frank breech presentation, meconium stained skin. Amniotic fluid - Thick Meconium  Birth weight 3380 g.  Apgars of 1 and 9.  Fetal cord gas with mixed acidemia: ph 6.96 with high CO2 and normal bicarb Intact placenta with a three-vessel cord.  Grossly normal tubes and ovaries bilaterally. No intraabdominal adhesions were noted. Fetus delivered atraumatically, but minimal immediate effort so immediate cord clamping performed.  Uterus was significantly enlarged with fibroids, subserosal and intramural. Tight Bandl's ring at the cephalic border of the LUS. Significant bleeding and the  uterus did not compress down. Skin incision extension required to replace the uterus.   Modifier 22 for the significantly enlarged uterus and ring, taking extra  Procedure Details  The patient was taken to Operating Room, identified as the correct patient and the procedure verified as C-Section Delivery. A Time Out was held and the above information confirmed.  After induction of anesthesia, fetal positioning was confirmed and an attempt to quickly turn the baby cephalic was unsuccessful. The fetal heart tones remained at 150s, her baseline, throughout.   We prepped the abdomen the usual sterile manner. A Pfannenstiel incision was made and carried down through the subcutaneous tissue to the fascia. Fascial incision was made and extended transversely with the Mayo scissors. The fascia was separated from the underlying rectus tissue superiorly and inferiorly. The peritoneum was identified and entered bluntly. Peritoneal incision was extended longitudinally. The utero-vesical peritoneal reflection was incised transversely and a bladder flap was created digitally.   A tight Bandl's ring was noted, and a low transverse hysterotomy was made just below it. Significant bleeding noted at this point, and the fetus was delivered using standard breech maneuvers atraumatically. The umbilical cord was clamped x2 and cut promptly and the infant was handed to the awaiting pediatricians. The placenta was removed intact and appeared normal with a 3-vessel cord. Cord arterial gas was collected by OR staff.  The uterus was exteriorized with effort and cleared of all clot and debris. The hysterotomy was closed in layers with running sutures of  0-Vicryl. A second imbricating layer was placed with the same suture.  Excellent hemostasis was observed once closed, with attention to the left angle and ligation of the left uterine artery.  Attention was then turned to the tubal ligation. The right fallopian tube distinguished  from the round ligament by identifying the fimbria and was grasped with a Babcock clamp in the midisthmic portion approximately 3 cm from the cornual region. It was then doubly ligated with 0-plain gut suture in a Parkland fashion. The tubal segment was excised with Metzenbaum scissors. Tubal ostea noted. The procedure was repeated on the left side. *Care was noted to examine both tubal sites in situ to ensure the sutures were intact and no bleeding was noted.   However, on replacing the uterus several areas of serosa bleeding on the fibroids were noted, and Surgicel placed on these areas.  The uterus was returned to the abdomen. Fimbriae removed   The pelvis was irrigated and again, excellent hemostasis was noted. The fascia was then reapproximated with running sutures of 0 Vicryl. The subcutaneous tissue was reapproximated with running sutures of 0 vicryl. The skin was reapproximated with Insorb absorbable staples. Exparel at the fascial and skin lines.  Instrument, sponge, and needle counts were correct prior to the abdominal closure and at the conclusion of the case.   The patient tolerated the procedure well and was transferred to the recovery room in stable condition.   Benjaman Kindler, MD9/01/2022

## 2022-02-02 NOTE — Anesthesia Procedure Notes (Signed)
Spinal  Patient location during procedure: OR Start time: 02/02/2022 9:38 AM End time: 02/02/2022 9:39 AM Reason for block: surgical anesthesia Staffing Performed: resident/CRNA  Anesthesiologist: Piscitello, Precious Haws, MD Resident/CRNA: Aline Brochure, CRNA Performed by: Aline Brochure, CRNA Authorized by: Andria Frames, MD   Preanesthetic Checklist Completed: patient identified, IV checked, site marked, risks and benefits discussed, surgical consent, monitors and equipment checked, pre-op evaluation and timeout performed Spinal Block Patient position: sitting Prep: ChloraPrep Patient monitoring: heart rate, continuous pulse ox, blood pressure and cardiac monitor Approach: midline Location: L3-4 Injection technique: single-shot Needle Needle type: Introducer and Pencan  Needle gauge: 24 G Needle length: 9 cm Assessment Sensory level: T10 Events: CSF return Additional Notes Sterile aseptic technique used throughout the procedure.  Negative paresthesia. Negative blood return. Positive free-flowing CSF. Expiration date of kit checked and confirmed. Patient tolerated procedure well, without complications.

## 2022-02-02 NOTE — H&P (Signed)
OB History & Physical   History of Present Illness:   Chief Complaint: painful uterine contractions and leaking of fluid  HPI:  Makinna Andy is a 41 y.o. [phone] female at [redacted]w[redacted]d No LMP recorded. Patient is pregnant., not consistent with [place]at 158w3dwith Estimated Date of Delivery: 02/05/22.  She presents to L&D for painful uterine contractions and LOF  Reports active fetal movement  Contractions: started at 2300 on 02/01/22 LOF/SROM: no Vaginal bleeding: no  Factors complicating pregnancy:  AMA age [age]besity FrPilar Platereech  Patient Active Problem List   Diagnosis Date Noted   Pregnancy 02/02/2022   Indication for care in labor or delivery 12/21/2015    Prenatal Transfer Tool  Maternal Diabetes: No Genetic Screening: Normal Maternal Ultrasounds/Referrals: Normal Fetal Ultrasounds or other Referrals:  None Maternal Substance Abuse:  No Significant Maternal Medications:  None Significant Maternal Lab Results: Group B Strep negative  Maternal Medical History:   Past Medical History:  Diagnosis Date   Medical history non-contributory     Past Surgical History:  Procedure Laterality Date   NO PAST SURGERIES      No Known Allergies  Prior to Admission medications   Medication Sig Start Date End Date Taking? Authorizing Provider  ferrous sulfate (FERROUSUL) 325 (65 FE) MG tablet Take 1 tablet (325 mg total) by mouth 2 (two) times daily. To help with anemia from delivery. Can cause constipation. 12/23/15  Yes BeBenjaman KindlerMD  Prenatal Vit-Fe Fumarate-FA (PRENATAL MULTIVITAMIN) TABS tablet Take 1 tablet by mouth daily at 12 noon.   Yes [provider]  docusate sodium (COLACE) 100 MG capsule Take 1 capsule (100 mg total) by mouth 2 (two) times daily as needed. 12/23/15   BeBenjaman KindlerMD  ibuprofen (ADVIL,MOTRIN) 600 MG tablet Take 1 tablet (600 mg total) by mouth every 6 (six) hours. 12/23/15   BeBenjaman KindlerMD  naproxen (NAPROSYN) 500 MG tablet  Take 1 tablet (500 mg total) by mouth 2 (two) times daily with a meal. 05/31/17   SuJohnn HaiPA-C     Prenatal care site:  [facility]  Gravida Para Term Preterm AB Living  '4 3 3 '$ 0 0 3  SAB IAB Ectopic Multiple Live Births  0 0 0 0 3    # Outcome Date GA Lbr Len/2nd Weight Sex Delivery Anes PTL Lv  4 Current           3 Term 12/21/15 [id number] 3670 g F Vag-Spont EPI  LIV     Name: URICARLISLE, TORGESON  Apgar1: 8  Apgar5: 9  2 Term 02/27/07 40w54w0d Vag-Spont   LIV  1 Term 04/09/04 40w066w0dVag-Spont   LIV     Social History: She  reports that she has never smoked. She has never used smokeless tobacco. She reports that she does not drink alcohol and does not use drugs.  Family History: family history is not on file.   Review of Systems: A full review of systems was performed and negative except as noted in the HPI.     Physical Exam:  Vital Signs: BP (!) 143/87   Pulse 83   Temp 98.4 F (36.9 C) (Oral)   Resp 16   Ht '5\' 2"'$  (1.575 m)   Wt 111.1 kg   BMI 44.81 kg/m   General: no acute distress.  HEENT: normocephalic, atraumatic Heart: regular rate & rhythm Lungs: normal  respiratory effort Abdomen: soft, gravid, non-tender;   Pelvic:   External: Normal external female genitalia  Cervix: Dilation: 2 / Effacement (%): Thick / Station: Ballotable    Extremities: non-tender, symmetric, mild edema bilaterally.  DTRs: +2  Neurologic: Alert & oriented x 3.    Results for orders placed or performed during the hospital encounter of 02/02/22 (from the past 24 hour(s))  CBC     Status: None   Collection Time: 02/02/22  5:37 AM  Result Value Ref Range   WBC 10.0 4.0 - 10.5 K/uL   RBC 4.02 3.87 - 5.11 MIL/uL   Hemoglobin 12.4 12.0 - 15.0 g/dL   HCT 36.2 36.0 - 46.0 %   MCV 90.0 80.0 - 100.0 fL   MCH 30.8 26.0 - 34.0 pg   MCHC 34.3 30.0 - 36.0 g/dL   RDW 13.8 11.5 - 15.5 %   Platelets 204 150 - 400 K/uL   nRBC 0.2 0.0 - 0.2 %   Comprehensive metabolic panel     Status: Abnormal   Collection Time: 02/02/22  5:37 AM  Result Value Ref Range   Sodium 135 135 - 145 mmol/L   Potassium 3.8 3.5 - 5.1 mmol/L   Chloride 106 98 - 111 mmol/L   CO2 19 (L) 22 - 32 mmol/L   Glucose, Bld 98 70 - 99 mg/dL   BUN 14 6 - 20 mg/dL   Creatinine, Ser 0.69 0.44 - 1.00 mg/dL   Calcium 9.1 8.9 - 10.3 mg/dL   Total Protein 6.8 6.5 - 8.1 g/dL   Albumin 3.0 (L) 3.5 - 5.0 g/dL   AST 25 15 - 41 U/L   ALT 12 0 - 44 U/L   Alkaline Phosphatase 124 38 - 126 U/L   Total Bilirubin 0.4 0.3 - 1.2 mg/dL   GFR, Estimated >60 >60 mL/min   Anion gap 10 5 - 15  Protein / creatinine ratio, urine     Status: None   Collection Time: 02/02/22  5:37 AM  Result Value Ref Range   Creatinine, Urine 22 mg/dL   Total Protein, Urine <6 mg/dL   Protein Creatinine Ratio        0.00 - 0.15 mg/mg[Cre]    Pertinent Results:  Prenatal Labs: Blood type/Rh B POS  Antibody screen neg  Rubella immune    Varicella Immune  RPR NR  HBsAg   Neg  Hep C NR  HIV   NR  GC neg  Chlamydia neg  Genetic screening cfDNA negative  1 hour GTT 103  3 hour GTT N/A  GBS   neg     FHT:  FHR: 155 bpm, variability: minimal to moderate,  accelerations:  Abscent,  decelerations:  Present late decelerations Category/reactivity:  Category II UC:   irregular, every 10 minutes   Frank Breech by [place]  No results found.  Assessment:  Bekka Qian is a 41 y.o. [phone] female at 54w4dwith AMA, Obesity and FPilar PlateBreech.   Plan:  1. Admit to Labor & Delivery - consents reviewed and obtained - Dr. BLeafy Ronotified of admission and plan of care   2. Fetal Well being  - Fetal Tracing: Category - Group B Streptococcus ppx not indicated: GBS negative - Presentation: FPilar PlateBreech confirmed by [place]  3. Routine OB: - Prenatal labs reviewed, as above - Rh positive - CBC, T&S, RPR on admit - NPO, continuous IV fluids  4. -Primary C-Section( fetal positon ) -   Prepare patient  for C-Section - Plan for  continuous fetal monitoring - Maternal pain control as desired; planning  Spinal - Anticipate Primary C- Section  5. Post Partum Planning: - Infant feeding: breast feeding - Contraception: Depo-Provera - Tdap vaccine: Given prenatally - Flu vaccine:  N/A  Zyria Fiscus Marlaine Hind, CNM 82/50/03 7:04 AM  Avelino Leeds, CNM Certified Nurse Midwife [facility]

## 2022-02-03 ENCOUNTER — Ambulatory Visit: Payer: Self-pay

## 2022-02-03 LAB — COMPREHENSIVE METABOLIC PANEL
ALT: 13 U/L (ref 0–44)
AST: 23 U/L (ref 15–41)
Albumin: 2.5 g/dL — ABNORMAL LOW (ref 3.5–5.0)
Alkaline Phosphatase: 92 U/L (ref 38–126)
Anion gap: 7 (ref 5–15)
BUN: 19 mg/dL (ref 6–20)
CO2: 20 mmol/L — ABNORMAL LOW (ref 22–32)
Calcium: 8.5 mg/dL — ABNORMAL LOW (ref 8.9–10.3)
Chloride: 110 mmol/L (ref 98–111)
Creatinine, Ser: 0.84 mg/dL (ref 0.44–1.00)
GFR, Estimated: >60 mL/min (ref >60)
Glucose, Bld: 129 mg/dL — ABNORMAL HIGH (ref 70–99)
Potassium: 4 mmol/L (ref 3.5–5.1)
Sodium: 137 mmol/L (ref 135–145)
Total Bilirubin: 0.2 mg/dL — ABNORMAL LOW (ref 0.3–1.2)
Total Protein: 5.7 g/dL — ABNORMAL LOW (ref 6.5–8.1)

## 2022-02-03 LAB — CBC
HCT: 29.8 % — ABNORMAL LOW (ref 36.0–46.0)
Hemoglobin: 10.2 g/dL — ABNORMAL LOW (ref 12.0–15.0)
MCH: 31.6 pg (ref 26.0–34.0)
MCHC: 34.2 g/dL (ref 30.0–36.0)
MCV: 92.3 fL (ref 80.0–100.0)
Platelets: 201 10*3/uL (ref 150–400)
RBC: 3.23 MIL/uL — ABNORMAL LOW (ref 3.87–5.11)
RDW: 14 % (ref 11.5–15.5)
WBC: 13.5 10*3/uL — ABNORMAL HIGH (ref 4.0–10.5)
nRBC: 0 % (ref 0.0–0.2)

## 2022-02-03 LAB — RPR: RPR Ser Ql: NONREACTIVE

## 2022-02-03 MED ORDER — SIMETHICONE 80 MG PO CHEW: 80.0000 mg | CHEWABLE_TABLET | Freq: Three times a day (TID) | ORAL | 0 refills | Status: AC

## 2022-02-03 MED ORDER — IBUPROFEN 600 MG PO TABS: 600.0000 mg | ORAL_TABLET | Freq: Four times a day (QID) | ORAL | Status: DC

## 2022-02-03 MED ORDER — GABAPENTIN 300 MG PO CAPS: 300.0000 mg | ORAL_CAPSULE | Freq: Every day | ORAL | 0 refills | Status: AC

## 2022-02-03 MED ORDER — OXYCODONE HCL 5 MG PO TABS: 5.0000 mg | ORAL_TABLET | ORAL | 0 refills | Status: AC | PRN

## 2022-02-03 MED ORDER — WITCH HAZEL-GLYCERIN EX PADS: 1.0000 | MEDICATED_PAD | CUTANEOUS | 12 refills | Status: AC | PRN

## 2022-02-03 MED ORDER — IBUPROFEN 600 MG PO TABS: 600.0000 mg | ORAL_TABLET | Freq: Four times a day (QID) | ORAL | 0 refills | Status: AC

## 2022-02-03 MED ORDER — DIPHENHYDRAMINE HCL 25 MG PO CAPS: 25.0000 mg | ORAL_CAPSULE | Freq: Four times a day (QID) | ORAL | 0 refills | Status: AC | PRN

## 2022-02-03 MED ORDER — FERROUS SULFATE 325 (65 FE) MG PO TABS: 325.0000 mg | ORAL_TABLET | Freq: Two times a day (BID) | ORAL | 3 refills | Status: AC

## 2022-02-03 MED ORDER — ENOXAPARIN SODIUM 60 MG/0.6ML IJ SOSY: 0.5000 mg/kg | PREFILLED_SYRINGE | INTRAMUSCULAR | 0 refills | Status: AC

## 2022-02-03 MED ORDER — SENNOSIDES-DOCUSATE SODIUM 8.6-50 MG PO TABS: 2.0000 | ORAL_TABLET | ORAL | 0 refills | Status: AC

## 2022-02-03 MED ORDER — ACETAMINOPHEN 500 MG PO TABS: 1000.0000 mg | ORAL_TABLET | Freq: Four times a day (QID) | ORAL | 0 refills | Status: AC

## 2022-02-03 MED ORDER — COCONUT OIL OIL: 1.0000 | TOPICAL_OIL | 0 refills | Status: AC | PRN

## 2022-02-03 MED ORDER — KETOROLAC TROMETHAMINE 30 MG/ML IJ SOLN
30.0000 mg | Freq: Four times a day (QID) | INTRAMUSCULAR | Status: DC
  Administered 2022-02-03 (×2): 30 mg via INTRAVENOUS
  Filled 2022-02-03 (×2): qty 1

## 2022-02-03 NOTE — Lactation Note (Signed)
This note was copied from a baby's chart.  NICU Lactation Consultation Note  Patient Name: Sarah Patton [id number] Date: 02/03/2022 Age:40 hours  Subjective Reason for consult: Initial assessment; Term; Other (Comment); NICU baby (AMA, Transfer from [facility])  Visited with family of 15 hours old FT NICU female, Sarah Patton is a P4 and reports she was set up with a DEBP during her stay at [facility] (baby was in the special care nursery) but that she left the pump pieces behind, this LC set up a DEBP in baby's room and assisted with her pumping session. MOB voiced that "nothing comes out when she pumps. explained that the purpose of pumping this early on is mainly for breast stimulation and not to get volume, she voiced understanding. Reviewed pumping schedule, lactogenesis II, maternal discharge education and anticipatory guidelines. This LC also offered a [facility] loaner pump to take home today, but MOB politely declined, she said she'll get hers tomorrow when the [facility] office opens, advised her to call first thing in the morning.  Objective Infant data: Mother's Current Feeding Choice: -- (NPO)   Maternal data: [id number]  C-Section, Low Transverse Significant Breast History:: moderate breast changes during the pregnancy Current breast feeding challenges:: NICU admission Does the patient have breastfeeding experience prior to this delivery?: Yes How long did the patient breastfeed?: Breastfed all her other kids for 6-12 months Pumping frequency: q 3 hours (recommended) Pumped volume: 0 mL Flange Size: 27 Risk factor for low milk supply:: infant separation, AMA WIC Program: Yes WIC Referral Sent?: Yes  Assessment Infant: In NICU  Maternal: Breasts are soft  Intervention/Plan Interventions: Breast feeding basics reviewed; DEBP; "The NICU and Your Baby" book; [employer] brochure; Education Tools: Pump; Flanges Pump Education: Setup, frequency, and cleaning; Milk Storage  Plan of  care: Encouraged pumping every 3 hours, ideally 8 pumping sessions/24 hours She'll P/U her pump from the [facility] office on 02/04/2022  FOB and siblings present and supportive. All questions and concerns answered, family to contact [facility] services PRN.  Consult Status: NICU follow-up  NICU Follow-up type: Verify onset of copious milk; Verify absence of engorgement; Weekly NICU follow up   [employer] 02/03/2022, 5:01 PM

## 2022-02-03 NOTE — Anesthesia Post-op Follow-up Note (Signed)
  Anesthesia Pain Follow-up Note  Patient: Sarah Patton  Day #: 1  Date of Follow-up: 02/03/2022 Time: 2:41 PM  Last Vitals:  Vitals:   02/03/22 0520 02/03/22 0840  BP: 106/64 117/73  Pulse: 80 78  Resp: 18 17  Temp: 36.6 C 36.4 C  SpO2: 99% 98%    Level of Consciousness: alert  Pain: mild   Side Effects:None  Catheter Site Exam:clean, dry  Anti-Coag Meds (From admission, onward)   Start     Dose/Rate Route Frequency Ordered Stop   02/03/22 0800  enoxaparin (LOVENOX) injection 55 mg        0.5 mg/kg  111.1 kg Subcutaneous Every 24 hours 02/02/22 1432     02/03/22 0000  enoxaparin (LOVENOX) 60 MG/0.6ML injection        0.5 mg/kg  111.1 kg Subcutaneous Every 24 hours 02/03/22 0939 02/24/22 2359       Plan: D/C from anesthesia care at surgeon's request  Martha Clan

## 2022-02-03 NOTE — Lactation Note (Signed)
This note was copied from a baby's chart. Lactation Consultation Note  Patient Name: Sarah Patton Date: 02/03/2022   Age:40 hours  NICU charge RN Tisa called this [place] because the [facility] from [place] was trying to get on hold with NICU LC @ [place]. LC Pat wanted to find out if there is program to assist mom with a pump, she had already sent a [place] referral to [facility] but since today is a Sunday she wanted to make sure MOB has a pump at home in addition to the hand pump she was given at [employer]. Informed of [facility] loaner program for [facility] participants, this LC will F/U with family once they come to visit baby in our NICU, mom was just getting discharged from [facility] at the time of this call.   Sarah Patton 02/03/2022, 4:20 PM

## 2022-02-03 NOTE — Progress Notes (Signed)
Mother discharged. Discharge instructions given. Mother verbalizes understanding. Transported by staff.  

## 2022-02-03 NOTE — Lactation Note (Addendum)
Lactation Consultation Note  Patient Name: Sarah Patton HHIDU'P Date: 02/03/2022   Age:41 y.o.  Maternal Data  This is mom's 75th baby,who was born by Primary C/S. Mom with history of AMA and obesity. Baby in SCN transferred to NICU at [facility] in [place] r/t seizures.  Met with mom today who is pumping for baby in NICU. Mom's concerns are why she is getting such small amounts or nothing when pumping.  Feeding  Mom plans to breastfeed her baby and also use formula supplement per choice on admission.   Lactation Tools Discussed/Used  DEBP  Interventions  DEBP, education regarding normative milk volumes to expect, how to set-up and clean breast pump parts, milk storage guidelines, breast engorgement and care management.  Discharge  Mom has harmony pump for milk expression overnight. [facility] referral faxed for request for loaner DEBP to [place] office as mom reports this is where she goes for [facility].  Consult Status  Complete  All dialogue with mom with assistance of Spanish Interpreter via [place].  Update provided to care nurse.  Jonna Declan Adamson 02/03/2022, 1:37 PM

## 2022-02-03 NOTE — Progress Notes (Signed)
Notified by RN that pts baby was transferred to [facility] due to seizures. Will facilitate early discharge for pt, she is voiding, pain well controlled and vitals stable.   Francetta Found, CNM 02/03/2022 9:17 AM

## 2022-02-03 NOTE — Anesthesia Postprocedure Evaluation (Signed)
Anesthesia Post Note  Patient: Sarah Patton  Procedure(s) Performed: CESAREAN SECTION WITH BILATERAL TUBAL LIGATION (Bilateral)  Patient location during evaluation: Mother Baby Anesthesia Type: Spinal Level of consciousness: oriented and awake and alert Pain management: pain level controlled Vital Signs Assessment: post-procedure vital signs reviewed and stable Respiratory status: spontaneous breathing and respiratory function stable Cardiovascular status: blood pressure returned to baseline and stable Postop Assessment: no headache, no backache, no apparent nausea or vomiting and able to ambulate Anesthetic complications: no   No notable events documented.   Last Vitals:  Vitals:   02/03/22 0520 02/03/22 0840  BP: 106/64 117/73  Pulse: 80 78  Resp: 18 17  Temp: 36.6 C 36.4 C  SpO2: 99% 98%    Last Pain:  Vitals:   02/03/22 0840  TempSrc: Oral  PainSc: 0-No pain                 Martha Clan

## 2022-02-04 ENCOUNTER — Encounter: Payer: Self-pay | Admitting: Obstetrics and Gynecology

## 2022-02-05 LAB — SURGICAL PATHOLOGY

## 2022-02-06 LAB — SURGICAL PATHOLOGY

## 2022-02-11 ENCOUNTER — Ambulatory Visit: Payer: Self-pay

## 2022-02-11 NOTE — Lactation Note (Signed)
This note was copied from a baby's chart.  NICU Lactation Consultation Note  Patient Name: Sarah Patton HTDSK'A Date: 02/11/2022 Age:40 days   Subjective Reason for consult: Follow-up assessment; Mother's request; Breastfeeding assistance Infant at breast when I arrived. Mother endorses strong tug without pinching/pain. I observed a few swallows but infant mostly sucked non-nutritively during my visit. My impression is that infant was sleepy during my observation but will likely bf wnl. Mother has a normal milk supply and hx of bf'ing other children.   Objective Infant data: Mother's Current Feeding Choice: Breast Milk  Infant feeding assessment Scale for Readiness: 2 Scale for Quality: -- (breastfeed)     Maternal data: [id number]  C-Section, Low Transverse Pumping frequency: q3h Pumped volume: 120 mL WIC Program: Yes WIC Referral Sent?: Yes  Assessment Infant: LATCH Score: 9  Maternal: Milk volume: Normal   Intervention/Plan Interventions: Education  Plan: Consult Status: NICU follow-up  NICU Follow-up type: Weekly NICU follow up; Assist with IDF-2 (Mother does not need to pre-pump before breastfeeding)  Continue to offer breast at feeding times  Gwynne Edinger 02/11/2022, 5:25 PM

## 2022-02-14 NOTE — Progress Notes (Signed)
 Spanish interpreter present ID# [id number]   OBSTETRICS/GYNECOLOGY VISIT  History of Present Illness:  Sarah Patton is a 41 y.o.  [phone]  who returns today for a postoperative check following an uncomplicated pLTCS, Bilateral tubal sterilization with modified parkland method, including fimbriae, left uterine artery ligation and ECV under spinal attempted on 02/02/22. C/S indications: Dempsey breech, active labor, concerning Cat II strip  Baby girl Raquel.  Placental pathology 02/02/22 PLACENTA (511 G):  - MATURE PLACENTA FETAL MEMBRANES AND UMBILICAL CORD.  - TRIVESSEL UMBILICAL CORD (14.2 CM) WITH NO EVIDENCE OF FUNISITIS OR VASCULITIS.  - MEMBRANES WITH FOCAL MILD TO MODERATE MECONIUM HISTIOCYTOSIS.  - FOCAL PLACENTAL INFARCT, ESTIMATED 20% OF THE PLACENTAL PLATE.  - FOCAL INTERRVILLOUS FIBRIN.   Today: She reports she is doing well without problems, no fever, normal elimination, normal activities.   She is currently uncomfortable, but her pain is well controlled   She is breast feeling   The following portions of the patient's history were reviewed and updated as appropriate. Past Medical History:  has no past medical history on file. Past Surgical History:  has a past surgical history that includes Cesarean section and Tubal ligation. Current Medications: has a current medication list which includes the following prescription(s): acetaminophen , coconut oil (bulk), diphenhydramine , enoxaparin , ferrous sulfate , ibuprofen , m-natal plus, sennosides-docusate, witch hazel-glycerin  (hamamel), and gabapentin . Allergies: has no allergies on file.  Physical Exam:  BP 116/83   Pulse 103   Ht 157.5 cm (5' 2)   Wt (!) 101.6 kg (224 lb)   Breastfeeding Yes Comment: breast/bottle  BMI 40.97 kg/m   General appearance: Well nourished, well developed female in no acute distress.   Abdomen: Nontender to palpation, nondistended, no masses or hernias.  Incisions: healing well, no significant  drainage, no dehiscence, no significant erythema.  Neuro/Psych:  Normal mood and affect.   Pelvic exam: exam deferred  Plan:   Visit Needs:  Assessment:  Routine postoperative visit after Cesarean. Pt recovering well from surgery.   1. Postop care:   -Gradual return to normal activity and diet.  Restrictions discussed.  Questions answered.  Pt aware when to call for concerns, issues.    2. Contraception:   -Patient is feeding by breast. S/p tubal with her cesarean   She has her postpartum visit with Carlin Blamer   Return for Postpartum check.   ~~~~~~~~~~~~~~~~~~~~~~~~~~~~~~~~~~~~~~~~~~~~~~~~~~~~~~~~~~~~ This note is partially written by Marylynn Baptist, in the presence of and acting as the scribe of Dr. Heather Penton, who has reviewed, edited and added to the note to reflect her best personal medical judgment.  This note was generated in part with voice recognition software and I apologize for any typographical errors that were not detected and corrected.   Attestation Statement:   I personally performed the service. (TP)  BETHANY JOHNATHAN PENTON, MD

## 2023-02-13 ENCOUNTER — Other Ambulatory Visit: Payer: Self-pay | Admitting: Primary Care

## 2023-02-13 DIAGNOSIS — Z1231 Encounter for screening mammogram for malignant neoplasm of breast: Secondary | ICD-10-CM

## 2023-02-24 ENCOUNTER — Ambulatory Visit
Admission: RE | Admit: 2023-02-24 | Discharge: 2023-02-24 | Disposition: A | Discharge status: Home / Self Care | Payer: Medicaid Other | Source: Ambulatory Visit | Attending: Primary Care

## 2023-02-24 DIAGNOSIS — Z1231 Encounter for screening mammogram for malignant neoplasm of breast: Secondary | ICD-10-CM

## 2023-08-18 IMAGING — US US OB COMP +14 WK
1 series · 15 of 28 positions shown · non-contrast
Comparison: none

CLINICAL DATA: Supervision of normal intrauterine pregnancy in
multigravida, first trimeste

EXAM:
OBSTETRICAL ULTRASOUND >14 WKS

[Series 1: us ob comp + 14 wk · 15 of 136 slices shown]
[im 1/136]
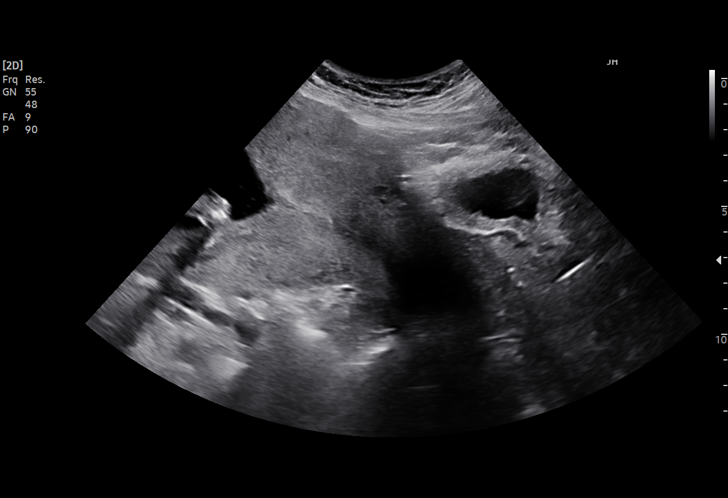
[im 11/136]
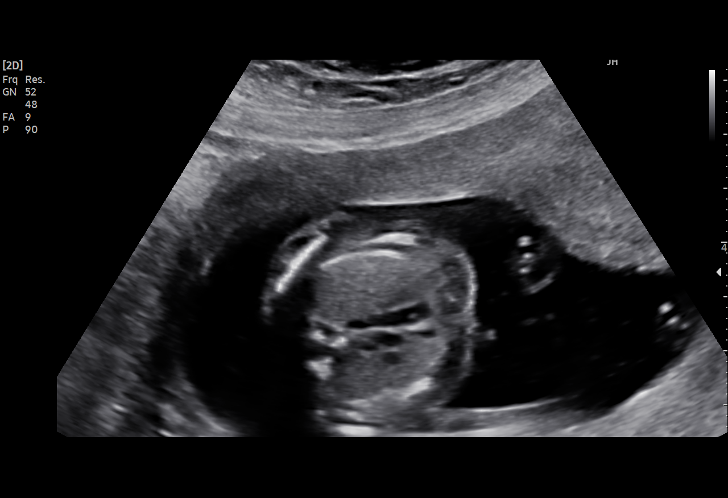
[im 21/136]
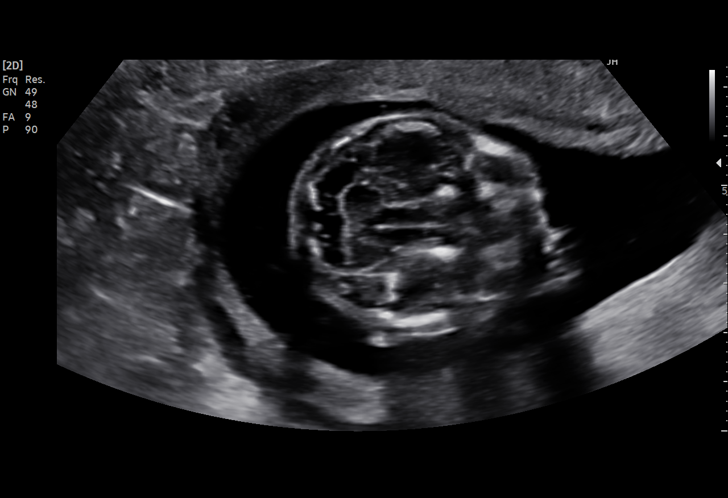
[im 31/136]
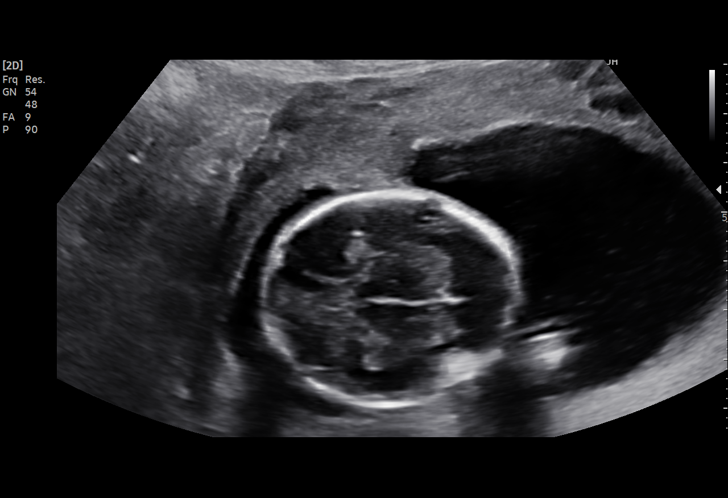
[im 41/136]
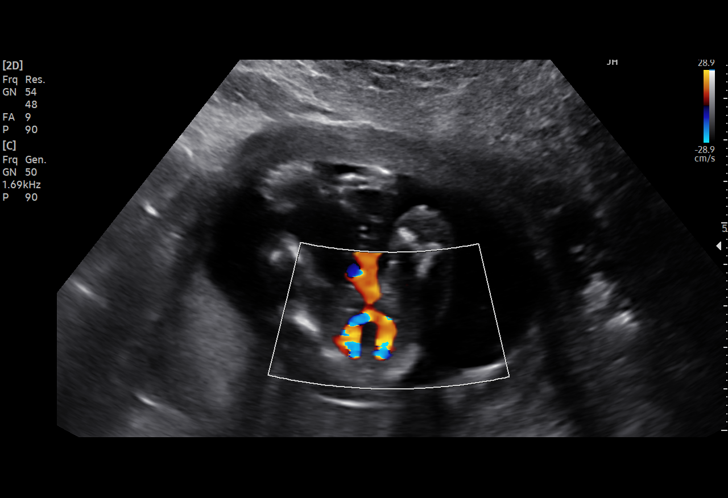
[im 51/136]
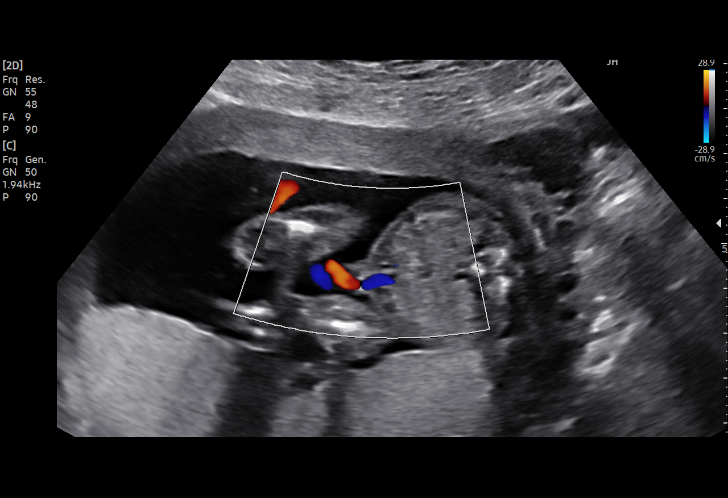
[im 61/136]
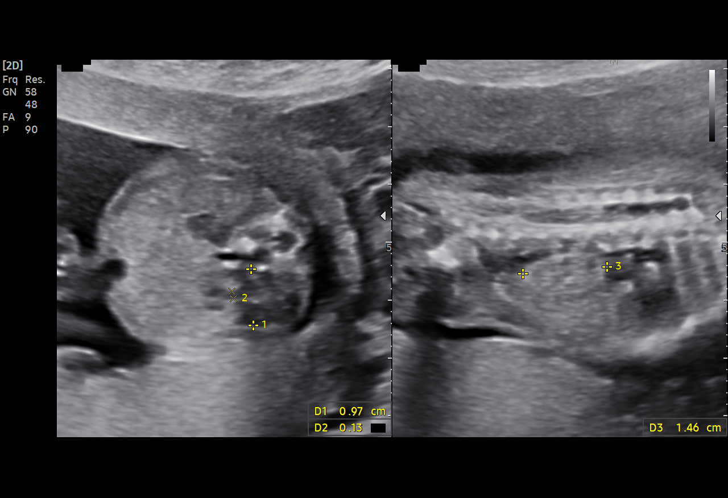
[im 71/136]
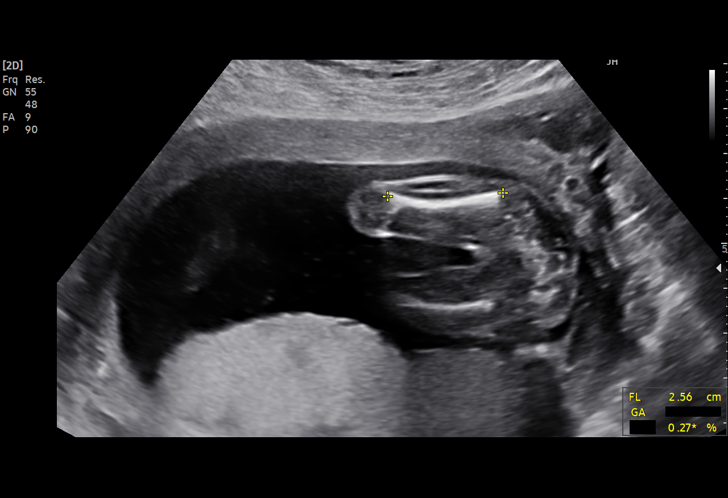
[im 76/136]
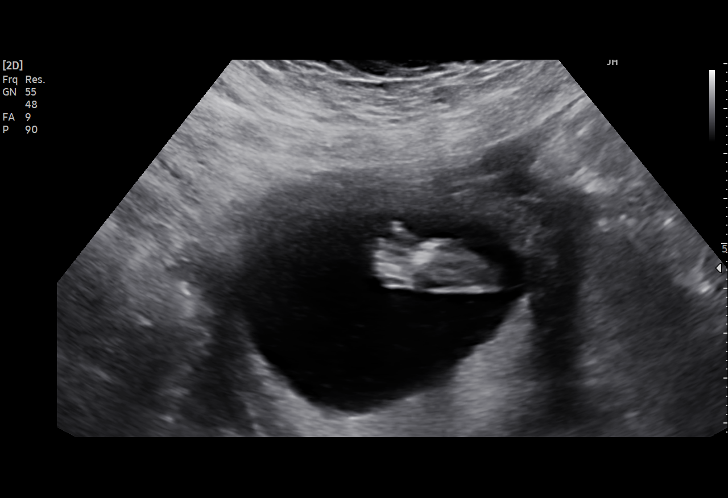
[im 86/136]
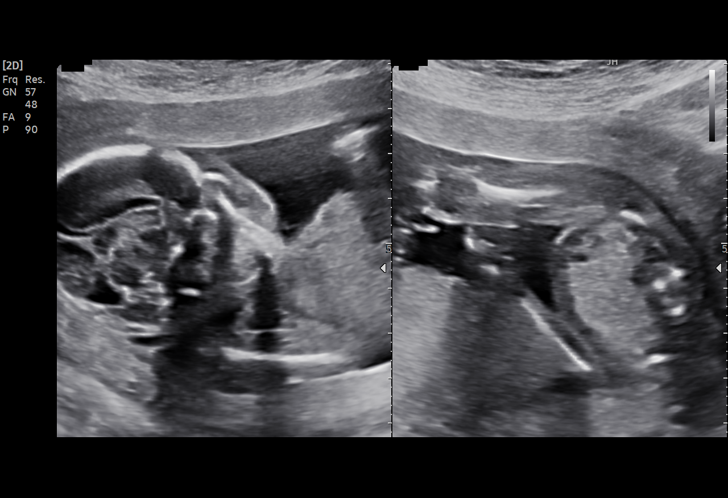
[im 96/136]
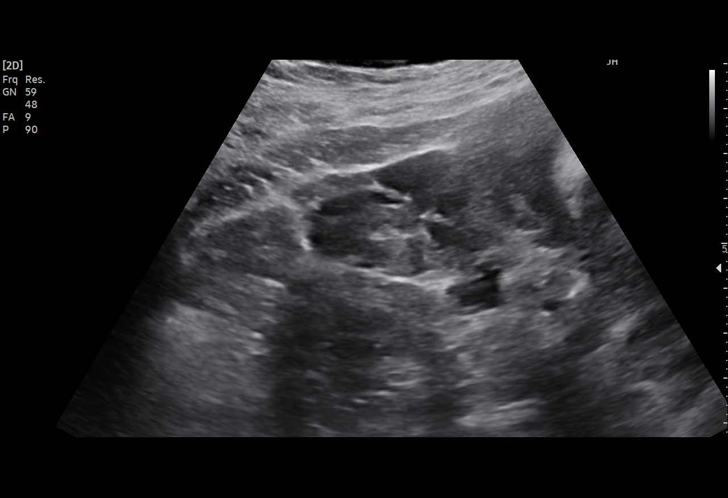
[im 106/136]
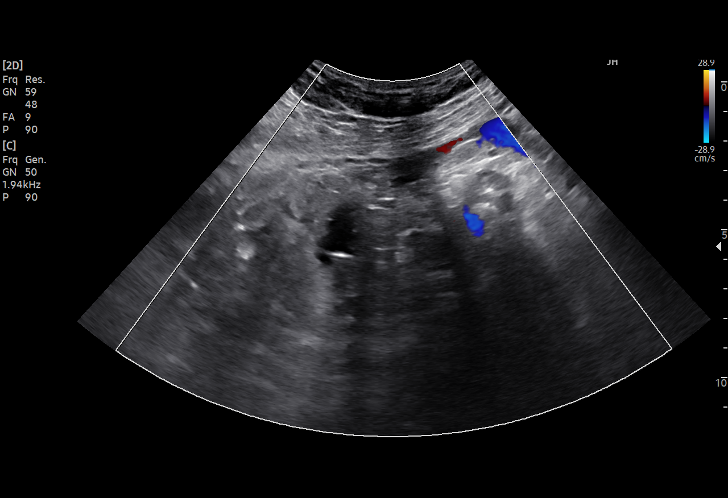
[im 116/136]
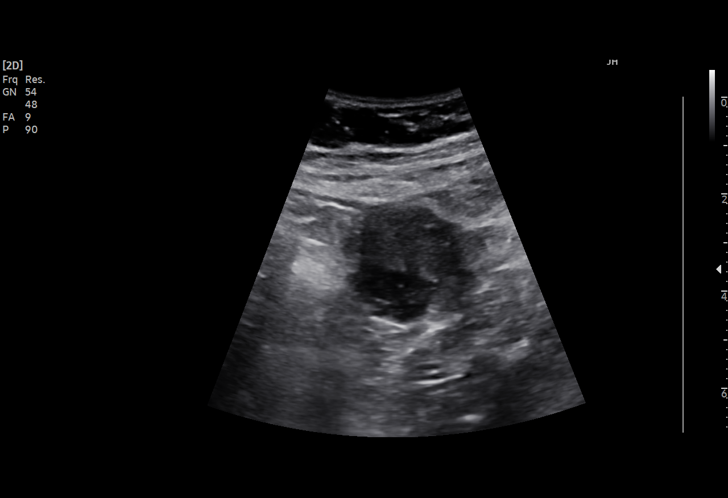
[im 126/136]
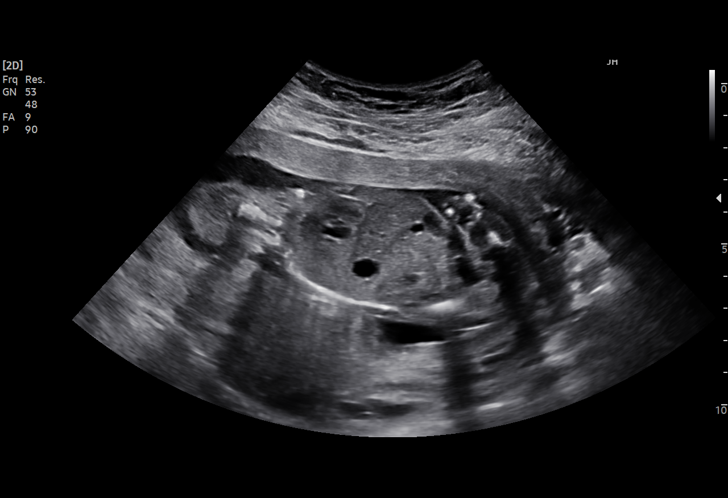
[im 136/136]
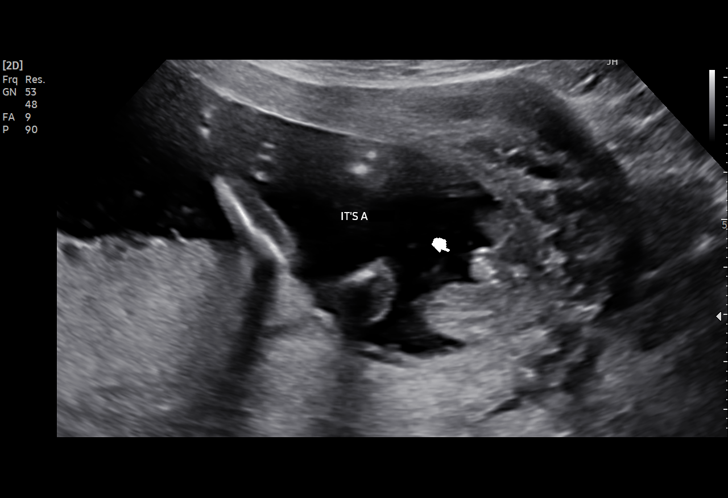

[15 of 28 positions shown; findings below may reference images not displayed]

FINDINGS: Number of Fetuses: 1

Heart Rate:  148 bpm

Movement: Yes

Presentation: Variable

Previa: No

Placental Location: Posterior

Amniotic Fluid (Subjective): Normal

Amniotic Fluid (Objective):

Vertical pocket = 4.1cm

FETAL BIOMETRY

BPD: 4.3cm 19w 0d

HC:   15.7cm 18w 4d

AC:   13.4cm 18w 6d

FL:   2.6cm 17w 6d

Current Mean GA: 18w 3d US EDC: 02/05/2022

Assigned GA:  18w 3d Assigned EDC: 02/05/2022

FETAL ANATOMY

Lateral Ventricles: Appears normal

Thalami/CSP: Appears normal

Posterior Fossa:  Appears normal

Nuchal Region: Appears normal   NFT= 3.9 mm

Upper Lip: Appears normal

Spine: Not well visualized

4 Chamber Heart on Left: Appears normal

LVOT: Appears normal

RVOT: Appears normal

Stomach on Left: Appears normal

3 Vessel Cord: Appears normal

Cord Insertion site: Appears normal

Kidneys: Appears normal

Bladder: Appears normal

Extremities: Appears normal

Technically difficult due to: Fetal position

Maternal Findings:

Cervix:  5.1 cm
IMPRESSION: 1. Single live intrauterine pregnancy as above estimated age 18
weeks and 3 days.
2. Suboptimal visualization of the fetal spine. The remainder of the
anatomic survey is unremarkable. Short interval follow-up ultrasound
could be performed to better assess the fetal spine.

## 2024-01-19 ENCOUNTER — Other Ambulatory Visit: Payer: Self-pay

## 2024-01-19 DIAGNOSIS — Z1231 Encounter for screening mammogram for malignant neoplasm of breast: Secondary | ICD-10-CM

## 2024-02-23 ENCOUNTER — Ambulatory Visit: Admission: RE | Admit: 2024-02-23 | Payer: Self-pay | Source: Ambulatory Visit
